# Patient Record
Sex: Female | Born: 1955 | Race: White | Hispanic: No | Smoking: Never smoker
Health system: Western US, Academic
[De-identification: ages and names within clinical notes are randomized; demographics above are authoritative.]

## PROBLEM LIST (undated history)

## (undated) DIAGNOSIS — F329 Major depressive disorder, single episode, unspecified: Secondary | ICD-10-CM

## (undated) DIAGNOSIS — C499 Malignant neoplasm of connective and soft tissue, unspecified: Secondary | ICD-10-CM

## (undated) DIAGNOSIS — D219 Benign neoplasm of connective and other soft tissue, unspecified: Secondary | ICD-10-CM

## (undated) DIAGNOSIS — F32A Depression, unspecified: Secondary | ICD-10-CM

## (undated) DIAGNOSIS — F419 Anxiety disorder, unspecified: Principal | ICD-10-CM

## (undated) HISTORY — DX: Anxiety disorder, unspecified: F41.9

## (undated) HISTORY — DX: Depression, unspecified: F32.A

## (undated) HISTORY — DX: Benign neoplasm of connective and other soft tissue, unspecified: D21.9

## (undated) HISTORY — DX: Major depressive disorder, single episode, unspecified: F32.9

## (undated) HISTORY — DX: Malignant neoplasm of connective and soft tissue, unspecified: C49.9

---

## 1991-05-07 HISTORY — PX: ARTHROPLASTY, KNEE, TOTAL: SHX000058

## 1991-10-22 ENCOUNTER — Other Ambulatory Visit: Payer: Self-pay

## 2006-05-06 HISTORY — PX: HYSTERECTOMY, RADICAL: SHX000829

## 2010-05-06 HISTORY — PX: COLONOSCOPY: GILAB00002

## 2016-04-05 ENCOUNTER — Other Ambulatory Visit (HOSPITAL_BASED_OUTPATIENT_CLINIC_OR_DEPARTMENT_OTHER): Payer: Self-pay | Admitting: Internal Medicine

## 2016-04-05 MED ORDER — CITALOPRAM 40 MG TABLET
40.0000 mg | ORAL_TABLET | Freq: Every day | ORAL | 1 refills | Status: DC
Start: 2016-04-05 — End: 2016-06-03

## 2016-04-05 NOTE — Telephone Encounter (Signed)
sent 

## 2016-04-08 ENCOUNTER — Encounter (HOSPITAL_BASED_OUTPATIENT_CLINIC_OR_DEPARTMENT_OTHER): Admitting: Internal Medicine

## 2016-04-26 ENCOUNTER — Encounter (HOSPITAL_BASED_OUTPATIENT_CLINIC_OR_DEPARTMENT_OTHER): Payer: Self-pay | Admitting: Internal Medicine

## 2016-04-26 ENCOUNTER — Ambulatory Visit (HOSPITAL_BASED_OUTPATIENT_CLINIC_OR_DEPARTMENT_OTHER): Admitting: Internal Medicine

## 2016-04-26 VITALS — BP 130/70 | HR 75 | Temp 97.0°F | Resp 16 | Ht 68.0 in | Wt 189.0 lb

## 2016-04-26 DIAGNOSIS — Z0000 Encounter for general adult medical examination without abnormal findings: Secondary | ICD-10-CM

## 2016-04-26 DIAGNOSIS — Z1231 Encounter for screening mammogram for malignant neoplasm of breast: Secondary | ICD-10-CM

## 2016-04-26 DIAGNOSIS — Z1239 Encounter for other screening for malignant neoplasm of breast: Secondary | ICD-10-CM

## 2016-04-26 DIAGNOSIS — F32A Depression, unspecified: Secondary | ICD-10-CM | POA: Insufficient documentation

## 2016-04-26 DIAGNOSIS — F419 Anxiety disorder, unspecified: Principal | ICD-10-CM

## 2016-04-26 DIAGNOSIS — E782 Mixed hyperlipidemia: Secondary | ICD-10-CM

## 2016-04-26 DIAGNOSIS — F418 Other specified anxiety disorders: Secondary | ICD-10-CM

## 2016-04-26 DIAGNOSIS — F329 Major depressive disorder, single episode, unspecified: Principal | ICD-10-CM

## 2016-04-26 DIAGNOSIS — E559 Vitamin D deficiency, unspecified: Secondary | ICD-10-CM
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
## 2016-04-26 NOTE — Progress Notes (Deleted)
HPI  Present to the clinic for BP follow up ,med refill and mammogram .  HTN -she brought her BP log and BP runs 140s and 80-90 . She is taking BP as prescribed  . Not eating healthy . She is doing brisk walking every day . No chest pain ,sob,palpitation .  Colonoscopy -was done when she was 32 .Normal colonoscopy per patient .  Mammogram-she is due for mammogram .  PAP- Was doen few months back        Review of Systems   Constitutional: Negative.    HENT: Negative.    Respiratory: Negative.    Cardiovascular: Negative.    Musculoskeletal: Positive for back pain (Lower backpain radaite to right legs ).   Neurological: Negative.           Physical Exam   Constitutional: She is oriented to person, place, and time. She appears well-developed and well-nourished.   HENT:   Head: Normocephalic and atraumatic.   Cardiovascular: Normal rate and regular rhythm.    Pulmonary/Chest: Effort normal and breath sounds normal.   Neurological: She is alert and oriented to person, place, and time.   Skin: Skin is warm and dry.   Psychiatric: She has a normal mood and affect.         ***

## 2016-05-16 ENCOUNTER — Ambulatory Visit

## 2016-05-24 ENCOUNTER — Encounter (HOSPITAL_BASED_OUTPATIENT_CLINIC_OR_DEPARTMENT_OTHER): Payer: Self-pay | Admitting: Internal Medicine

## 2016-06-03 ENCOUNTER — Other Ambulatory Visit (HOSPITAL_BASED_OUTPATIENT_CLINIC_OR_DEPARTMENT_OTHER): Payer: Self-pay | Admitting: Internal Medicine

## 2016-06-03 NOTE — Telephone Encounter (Signed)
lov 12.22.17  No f/u

## 2016-06-11 ENCOUNTER — Ambulatory Visit

## 2016-06-11 ENCOUNTER — Ambulatory Visit
Admission: RE | Admit: 2016-06-11 | Discharge: 2016-06-16 | Disposition: A | Discharge status: Home / Self Care | Source: Ambulatory Visit | Attending: Internal Medicine | Admitting: Internal Medicine

## 2016-06-11 DIAGNOSIS — Z1231 Encounter for screening mammogram for malignant neoplasm of breast: Principal | ICD-10-CM

## 2016-06-11 DIAGNOSIS — E782 Mixed hyperlipidemia: Secondary | ICD-10-CM

## 2016-06-11 DIAGNOSIS — Z0000 Encounter for general adult medical examination without abnormal findings: Secondary | ICD-10-CM

## 2016-06-11 DIAGNOSIS — Z1239 Encounter for other screening for malignant neoplasm of breast: Secondary | ICD-10-CM

## 2016-06-11 DIAGNOSIS — E559 Vitamin D deficiency, unspecified: Secondary | ICD-10-CM

## 2016-06-11 LAB — COMPREHENSIVE METABOLIC PANEL
ALANINE TRANSFERASE (ALT): 24 U/L (ref 4–56)
ALB/GLOB RATIO_MMC: 1.3 (ref 1.0–1.6)
ALBUMIN: 4.1 g/dL (ref 3.2–4.7)
ALKALINE PHOSPHATASE (ALP): 66 U/L (ref 38–126)
ASPARTATE TRANSAMINASE (AST): 27 U/L (ref 9–44)
BILIRUBIN TOTAL: 0.6 mg/dL (ref 0.1–2.2)
BUN/CREATININE_MMC: 19.1 (ref 7.3–21.7)
CALCIUM: 9.9 mg/dL (ref 8.7–10.2)
CARBON DIOXIDE TOTAL: 29 mmol/L (ref 22–32)
CHLORIDE: 103 mmol/L (ref 99–109)
CREATININE BLOOD: 0.68 mg/dL (ref 0.50–1.30)
E-GFR, NON-AFRICAN AMERICAN: 88 mL/min/{1.73_m2}
E-GFR_MMC: 88 mL/min/{1.73_m2}
GLOBULIN BLOOD_MMC: 3.2 g/dL (ref 2.2–4.2)
Glucose: 95 mg/dL (ref 70–100)
POTASSIUM: 3.6 mmol/L (ref 3.5–5.2)
PROTEIN: 7.3 g/dL (ref 5.9–8.2)
Sodium: 139 mmol/L (ref 134–143)
UREA NITROGEN, BLOOD (BUN): 13 mg/dL (ref 6–21)

## 2016-06-11 LAB — CBC WITH DIFFERENTIAL
BASOPHILS % AUTO: 0.6 % (ref 0.0–1.0)
BASOPHILS ABS AUTO: 0 10*3/uL (ref 0.0–0.1)
EOSINOPHIL % AUTO: 2.8 % (ref 0.0–4.0)
EOSINOPHIL ABS AUTO: 0.2 10*3/uL (ref 0.0–0.2)
HEMATOCRIT: 41.3 % (ref 36.0–48.0)
HEMOGLOBIN: 14 g/dL (ref 12.0–16.0)
IMMATURE GRANULOCYTES % AUTO: 0 % (ref 0.00–0.50)
IMMATURE GRANULOCYTES ABS AUTO: 0 10*3/uL (ref 0.0–0.0)
LYMPHOCYTE ABS AUTO: 1.2 10*3/uL — AB (ref 1.3–2.9)
LYMPHOCYTES % AUTO: 22.2 % (ref 5.0–41.0)
MCH: 30.9 pg (ref 27.0–34.0)
MCHC G/DL: 33.9 g/dL (ref 33.0–37.0)
MCV: 91.2 fL (ref 82.0–97.0)
MONOCYTES % AUTO: 13.7 % — AB (ref 0.0–10.0)
MONOCYTES ABS AUTO: 0.7 10*3/uL (ref 0.3–0.8)
MPV: 9.4 fL (ref 9.4–12.4)
NEUTROPHIL ABS AUTO: 3.28 10*3/uL (ref 2.20–4.80)
NEUTROPHILS % AUTO: 60.7 % (ref 45.0–75.0)
NUCLEATED CELL COUNT: 0 10*3/uL (ref 0.0–0.1)
NUCLEATED RBC/100 WBC: 0 %{WBCs} (ref <=0.0)
PLATELET COUNT: 223 10*3/uL (ref 151–365)
RDW: 13.1 % (ref 11.5–14.5)
RED CELL COUNT: 4.53 10*6/uL (ref 3.80–5.10)
WHITE BLOOD CELL COUNT: 5.4 10*3/uL (ref 4.2–10.8)

## 2016-06-11 LAB — LIPID PANEL
CHOL HDL RATIO MMC: 5.2 mg/dL — AB (ref 2.0–5.0)
CHOLESTEROL: 279 mg/dL — AB (ref 112–200)
HDL CHOLESTEROL: 54 mg/dL (ref 40–?)
LDL CHOLESTEROL CALCULATION: 191 mg/dL — AB (ref <100)
NON-HDL CHOLESTEROL: 225 mg/dL — AB (ref <=150.0)
TRIGLYCERIDE: 168 mg/dL — AB (ref 30–150)

## 2016-06-11 LAB — VITAMIN D, 25 HYDROXY: VITAMIN D, 25 HYDROXY: 47 ng/mL (ref 30–80)

## 2016-06-17 ENCOUNTER — Encounter (HOSPITAL_BASED_OUTPATIENT_CLINIC_OR_DEPARTMENT_OTHER): Payer: Self-pay | Admitting: Internal Medicine

## 2016-06-17 ENCOUNTER — Ambulatory Visit (HOSPITAL_BASED_OUTPATIENT_CLINIC_OR_DEPARTMENT_OTHER): Admitting: Internal Medicine

## 2016-06-17 VITALS — BP 120/70 | HR 84 | Temp 96.7°F | Resp 16 | Wt 190.6 lb

## 2016-06-17 DIAGNOSIS — N952 Postmenopausal atrophic vaginitis: Secondary | ICD-10-CM | POA: Insufficient documentation

## 2016-06-17 DIAGNOSIS — F419 Anxiety disorder, unspecified: Secondary | ICD-10-CM

## 2016-06-17 DIAGNOSIS — F329 Major depressive disorder, single episode, unspecified: Secondary | ICD-10-CM

## 2016-06-17 DIAGNOSIS — E782 Mixed hyperlipidemia: Secondary | ICD-10-CM | POA: Insufficient documentation

## 2016-06-17 DIAGNOSIS — Z0000 Encounter for general adult medical examination without abnormal findings: Principal | ICD-10-CM

## 2016-06-17 DIAGNOSIS — F418 Other specified anxiety disorders: Secondary | ICD-10-CM

## 2016-06-17 MED ORDER — ATORVASTATIN 40 MG TABLET
40.0000 mg | ORAL_TABLET | Freq: Every day | ORAL | 1 refills | Status: DC
Start: 2016-06-17 — End: 2016-09-17

## 2016-06-17 MED ORDER — CONJUGATED ESTROGENS 0.625 MG/GRAM VAGINAL CREAM
0.5000 g | TOPICAL_CREAM | Freq: Every day | VAGINAL | 2 refills | Status: DC
Start: 2016-06-17 — End: 2016-09-17

## 2016-06-17 NOTE — Progress Notes (Signed)
Lauren Macdonald is a 61 year old woman with a PMH of anxiety and depression who came to the clinic today for an annual physical exam and follow up of labs.  She had blood work done and a mammogram done before this visit, which I reviewed in detail with her.      On review of her labs, her chem panel and CBC were normal, but her lipid panel showed an elevated LDL of 191. On further questioning, she states that she always had a history of high cholesterol in the past and even her mother suffers with the same problem of high cholesterol.  She was never on any medications in the past and always tried to control with diet and exercise.  She in fact mentioned that at one point her LDL was around 205.  She denies any history of coronary artery disease or stroke in the past.  She follows a low-cholesterol diet and exercises regularly.  I reviewed all the labs and mammogram in detail with her.     She walks for 1-2 miles every day and eats a healthy balanced diet.  last colonoscopy 2012, no polyps per pt  Last mammogram: 06/11/2016, reviewed results  Last PAP: s/p hysterectomy for uterine fibroids in 2008    Anxiety and depression: has been taking Citalopram 40 mg daily for >26yrs. Denied any active symptoms currently       Review of Systems   Constitutional: Negative for fatigue, fever and unexpected weight change.   HENT: Negative.    Eyes: Negative for visual disturbance.   Respiratory: Negative for cough and shortness of breath.    Cardiovascular: Negative for chest pain, palpitations and leg swelling.   Gastrointestinal: Negative for abdominal pain, blood in stool and constipation.   Endocrine: Negative.    Genitourinary: Positive for dyspareunia. Negative for dysuria, pelvic pain, vaginal bleeding and vaginal discharge.   Musculoskeletal: Positive for back pain. Negative for arthralgias.   Neurological: Negative for dizziness and headaches.   Psychiatric/Behavioral: Negative.           Physical Exam   Constitutional: She  is oriented to person, place, and time. She appears well-developed and well-nourished. No distress.   HENT:   Head: Normocephalic.   Right Ear: External ear normal.   Left Ear: External ear normal.   Nose: Nose normal.   Mouth/Throat: Oropharynx is clear and moist. No oropharyngeal exudate.   Eyes: Conjunctivae and EOM are normal. Pupils are equal, round, and reactive to light. No scleral icterus.   Neck: Normal range of motion. Neck supple. No thyromegaly present.   Cardiovascular: Normal rate, regular rhythm, normal heart sounds and intact distal pulses.    No murmur heard.  Pulmonary/Chest: Effort normal and breath sounds normal. No respiratory distress. She has no wheezes. She has no rales. She exhibits no tenderness.   Breast exam normal. No axillary adenopathy   Abdominal: Soft. Bowel sounds are normal. She exhibits no distension and no mass. There is no tenderness.   Genitourinary: No vaginal discharge found.   Genitourinary Comments: Dry and atrophic vaginal walls   Musculoskeletal: Normal range of motion. She exhibits no edema, tenderness or deformity.   Lymphadenopathy:     She has no cervical adenopathy.   Neurological: She is alert and oriented to person, place, and time. She has normal reflexes. No cranial nerve deficit.   Skin: Skin is warm. No rash noted.   Psychiatric: She has a normal mood and affect.   Vitals reviewed.  Marsi was seen today for results and physical.    Diagnoses and all orders for this visit:    Annual physical exam  - exam normal, labs reviewed in detail.  - continue to follow a healthy balanced diet and exercise regularly    Mixed hyperlipidemia  -     LIPID PANEL; Future  - lipid panel reviewed and LDL >190, which is in an indication for high intensity statin therapy. Will start Atorvastatin 40 mg and recheck lipid panel in 3 months.   - follow low cholesterol diet    Anxiety and depression  - continue celexa      Vaginal atrophy  - symp and exam consistent with pots  menopausal vaginal atrophy. Will start topical HRT.  - side effects discussed    Other orders  -     Atorvastatin (LIPITOR) 40 mg tablet; Take 1 tablet by mouth every day.  -     Conjugated Estrogens (PREMARIN) 0.625 mg/gram Vaginal Cream; Insert 0.5 g into the vagina every day. Daily for 1 week and then 1 gm twice weekly there after

## 2016-06-19 NOTE — Progress Notes (Deleted)
Lauren Macdonald is a 61 year old woman with a PMH of anxiety and depression who came to the clinic today for an annual physical exam and follow up of labs.  She had blood work done and a mammogram done before this visit, which I reviewed in detail with her.      On review of her labs, her chem panel and CBC were normal, but her lipid panel showed an elevated LDL of 191. On further questioning, she states that she always had a history of high cholesterol in the past and even her mother suffers with the same problem of high cholesterol.  She was never on any medications in the past and always tried to control with diet and exercise.  She in fact mentioned that at one point her LDL was around 205.  She denies any history of coronary artery disease or stroke in the past.  She follows a low-cholesterol diet and exercises regularly.  I reviewed all the labs and mammogram in detail with her.     She walks for 1-2 miles every day and eats a healthy balanced diet.  last colonoscopy 2012, no polyps per pt  Last mammogram: 06/11/2016, reviewed results  Last PAP: s/p hysterectomy for uterine fibroids in 2008    Anxiety and depression: has been taking Citalopram 40 mg daily for >55yrs. Denied any active symptoms currently     Review of Systems   Constitutional: Negative for fatigue, fever and unexpected weight change.   HENT: Negative.    Eyes: Negative for visual disturbance.   Respiratory: Negative for cough and shortness of breath.    Cardiovascular: Negative for chest pain, palpitations and leg swelling.   Gastrointestinal: Negative for abdominal pain, blood in stool and constipation.   Endocrine: Negative.    Genitourinary: Positive for dyspareunia. Negative for dysuria, pelvic pain, vaginal bleeding and vaginal discharge.   Musculoskeletal: Positive for back pain. Negative for arthralgias.   Neurological: Negative for dizziness and headaches.   Psychiatric/Behavioral: Negative.           Physical Exam   Constitutional: She is  oriented to person, place, and time. She appears well-developed and well-nourished. No distress.   HENT:   Head: Normocephalic.   Right Ear: External ear normal.   Left Ear: External ear normal.   Nose: Nose normal.   Mouth/Throat: Oropharynx is clear and moist. No oropharyngeal exudate.   Eyes: Conjunctivae and EOM are normal. Pupils are equal, round, and reactive to light. No scleral icterus.   Neck: Normal range of motion. Neck supple. No thyromegaly present.   Cardiovascular: Normal rate, regular rhythm, normal heart sounds and intact distal pulses.    No murmur heard.  Pulmonary/Chest: Effort normal and breath sounds normal. No respiratory distress. She has no wheezes. She has no rales. She exhibits no tenderness.   Breast exam normal. No axillary adenopathy   Abdominal: Soft. Bowel sounds are normal. She exhibits no distension and no mass. There is no tenderness.   Genitourinary: No vaginal discharge found.   Genitourinary Comments: Dry and atrophic vaginal walls   Musculoskeletal: Normal range of motion. She exhibits no edema, tenderness or deformity.   Lymphadenopathy:     She has no cervical adenopathy.   Neurological: She is alert and oriented to person, place, and time. She has normal reflexes. No cranial nerve deficit.   Skin: Skin is warm. No rash noted.   Psychiatric: She has a normal mood and affect.   Vitals reviewed.  Lauren Macdonald was seen today for results and physical.    Diagnoses and all orders for this visit:    Annual physical exam  - exam normal, labs reviewed in detail.  - continue to follow a healthy balanced diet and exercise regularly    Mixed hyperlipidemia  -     LIPID PANEL; Future  - lipid panel reviewed and LDL >190, which is in an indication for high intensity statin therapy. Will start Atorvastatin 40 mg and recheck lipid panel in 3 months.   - follow low cholesterol diet    Anxiety and depression  - continue celexa      Vaginal atrophy  - symp and exam consistent with pots  menopausal vaginal atrophy. Will start topical HRT.  - side effects discussed    Other orders  -     Atorvastatin (LIPITOR) 40 mg tablet; Take 1 tablet by mouth every day.  -     Conjugated Estrogens (PREMARIN) 0.625 mg/gram Vaginal Cream; Insert 0.5 g into the vagina every day. Daily for 1 week and then 1 gm twice weekly there after

## 2016-07-10 ENCOUNTER — Telehealth (HOSPITAL_BASED_OUTPATIENT_CLINIC_OR_DEPARTMENT_OTHER): Payer: Self-pay | Admitting: Internal Medicine

## 2016-07-10 NOTE — Telephone Encounter (Signed)
Pt states she has had Muscle pain, and stomach pain. Waking pt through out the night thinks it may be due to the new statin medication she is taking x1 month.   Please advise

## 2016-07-11 NOTE — Telephone Encounter (Signed)
Patient notified

## 2016-07-11 NOTE — Telephone Encounter (Signed)
Please tell her to stop the statin medication and see if the pain improves. She needs to call us back in a week for follow up

## 2016-09-17 ENCOUNTER — Encounter (HOSPITAL_BASED_OUTPATIENT_CLINIC_OR_DEPARTMENT_OTHER): Payer: Self-pay | Admitting: Internal Medicine

## 2016-09-17 ENCOUNTER — Ambulatory Visit (HOSPITAL_BASED_OUTPATIENT_CLINIC_OR_DEPARTMENT_OTHER): Admitting: Internal Medicine

## 2016-09-17 VITALS — BP 112/74 | HR 80 | Temp 97.5°F | Resp 16 | Wt 193.6 lb

## 2016-09-17 DIAGNOSIS — M5136 Other intervertebral disc degeneration, lumbar region: Secondary | ICD-10-CM

## 2016-09-17 DIAGNOSIS — M545 Low back pain: Secondary | ICD-10-CM

## 2016-09-17 DIAGNOSIS — E782 Mixed hyperlipidemia: Principal | ICD-10-CM

## 2016-09-17 DIAGNOSIS — N952 Postmenopausal atrophic vaginitis: Secondary | ICD-10-CM

## 2016-09-17 DIAGNOSIS — G8929 Other chronic pain: Secondary | ICD-10-CM

## 2016-09-17 MED ORDER — ATORVASTATIN 20 MG TABLET
20.0000 mg | ORAL_TABLET | Freq: Every day | ORAL | 1 refills | Status: AC
Start: 2016-09-17 — End: 2017-09-12

## 2016-09-17 MED ORDER — CONJUGATED ESTROGENS 0.625 MG/GRAM VAGINAL CREAM
1.0000 g | TOPICAL_CREAM | Freq: Every day | VAGINAL | 2 refills | Status: AC
Start: 2016-09-17 — End: 2017-09-12

## 2016-09-17 NOTE — Patient Instructions (Signed)
Back Stretches: Exercises  Your Care Instructions  Here are some examples of exercises for stretching your back. Start each exercise slowly. Ease off the exercise if you start to have pain.  Your doctor or physical therapist will tell you when you can start these exercises and which ones will work best for you.  How to do the exercises  Overhead stretch    1. Stand comfortably with your feet shoulder-width apart.  2. Looking straight ahead, raise both arms over your head and reach toward the ceiling. Do not allow your head to tilt back.  3. Hold for 15 to 30 seconds, then lower your arms to your sides.  4. Repeat 2 to 4 times.  Side stretch    1. Stand comfortably with your feet shoulder-width apart.  2. Raise one arm over your head, and then lean to the other side.  3. Slide your hand down your leg as you let the weight of your arm gently stretch your side muscles. Hold for 15 to 30 seconds.  4. Repeat 2 to 4 times on each side.  Press-up    1. Lie on your stomach, supporting your body with your forearms.  2. Press your elbows down into the floor to raise your upper back. As you do this, relax your stomach muscles and allow your back to arch without using your back muscles. As your press up, do not let your hips or pelvis come off the floor.  3. Hold for 15 to 30 seconds, then relax.  4. Repeat 2 to 4 times.  Relax and rest    1. Lie on your back with a rolled towel under your neck and a pillow under your knees. Extend your arms comfortably to your sides.  2. Relax and breathe normally.  3. Remain in this position for about 10 minutes.  4. If you can, do this 2 or 3 times each day.  Follow-up care is a key part of your treatment and safety. Be sure to make and go to all appointments, and call your doctor if you are having problems. It's also a good idea to know your test results and keep a list of the medicines you take.   Where can you learn more?   Go to https://www.healthwise.net/patiented  Enter Y090 in the  search box to learn more about "Back Stretches: Exercises."    2006-2015 [employer]. Care instructions adapted under license by [facility]. This care instruction is for use with your licensed healthcare professional. If you have questions about a medical condition or this instruction, always ask your healthcare professional. [employer], Inc[employer] disclaims any warranty or liability for your use of this information.  Content Version: 10.6.465758; Current as of: Sep 24, 2013

## 2016-09-17 NOTE — Progress Notes (Signed)
Lauren Macdonald is a 61 year old woman with a PMH of anxiety and depression, hyperlipidemia, lumbar DDD, vaginal atrophy who came to the clinic today for f/u of hyperlipidemia and other medical problems.     Hyperlipidemia: her LDL was 191, so I started her on Atorvastatin 40 mg which she could not tolerate. She reported nausea, diarrhea and fatigue. No myalgias. So she quit taking it. She denies any history of coronary artery disease or stroke in the past. She follows a low-cholesterol diet and exercises regularly.    Vaginal atrophy: she is using premarin topical cream twice weekly and reports improvement of dryness. Denied vaginal bleeding    Low back pain: toady she states that she has known Lumbar DDD and lately has been experiencing stiffness and pain in the lower back when she gets up in the morning. After few minutes pain improves. Pain is again worse at end of day. Denied radiation of pain, tingling, numbness,weakness.          Review of Systems   Constitutional: Negative for fatigue, fever and unexpected weight change.   HENT: Negative.    Eyes: Negative for visual disturbance.   Respiratory: Negative for cough and shortness of breath.    Cardiovascular: Negative for chest pain, palpitations and leg swelling.   Gastrointestinal: Negative for abdominal pain, blood in stool and constipation.   Endocrine: Negative.    Genitourinary: Negative for dyspareunia, dysuria, vaginal bleeding and vaginal discharge.   Musculoskeletal: Positive for back pain. Negative for arthralgias and myalgias.   Neurological: Negative for dizziness and headaches.   Psychiatric/Behavioral: Negative.           Physical Exam   Constitutional: She is oriented to person, place, and time. She appears well-developed and well-nourished.   HENT:   Mouth/Throat: Oropharynx is clear and moist.   Cardiovascular: Normal rate, regular rhythm and normal heart sounds.    No murmur heard.  Pulmonary/Chest: Effort normal and breath sounds normal. No  respiratory distress. She has no wheezes.   Abdominal: Soft. Bowel sounds are normal.   Musculoskeletal: She exhibits no edema.   Neurological: She is alert and oriented to person, place, and time.   Skin: Skin is warm.   Psychiatric: She has a normal mood and affect.   Vitals reviewed.        Ledia was seen today for lipids.    Diagnoses and all orders for this visit:    Mixed hyperlipidemia  - pt was not able to tolerate high dose statin  - given her LDL >190, she should be on a statin. No h/o CAD, DM, CVA  - advised to start 10 mg of atorvastatin and go up to 20 if she can tolerate  - follow low cholesterol diet and exercise.     Vaginal atrophy  - continue topical estrogen twice weekly    DDD (degenerative disc disease), lumbar  - take NSAID'S as needed    Chronic midline low back pain without sciatica  - advised stretching exercises    Other orders  -     Conjugated Estrogens (PREMARIN) 0.625 mg/gram Vaginal Cream; Insert 1 g into the vagina every day. 1 gm twice weekly  -     Atorvastatin (LIPITOR) 20 mg Tablet; Take 1 tablet by mouth every day.

## 2016-10-04 ENCOUNTER — Other Ambulatory Visit (HOSPITAL_BASED_OUTPATIENT_CLINIC_OR_DEPARTMENT_OTHER): Payer: Self-pay | Admitting: Internal Medicine

## 2016-10-04 NOTE — Telephone Encounter (Signed)
LOV 5.15.18  NOV 8.16.18

## 2016-12-19 ENCOUNTER — Ambulatory Visit (HOSPITAL_BASED_OUTPATIENT_CLINIC_OR_DEPARTMENT_OTHER): Admitting: Internal Medicine

## 2019-06-29 ENCOUNTER — Other Ambulatory Visit (HOSPITAL_BASED_OUTPATIENT_CLINIC_OR_DEPARTMENT_OTHER): Payer: Self-pay | Admitting: Internal Medicine

## 2019-06-29 DIAGNOSIS — Z1231 Encounter for screening mammogram for malignant neoplasm of breast: Secondary | ICD-10-CM

## 2020-02-18 IMAGING — CT CT ABD/PEL W CONT
2 of 4 series · 11 of 46 positions shown, 12 images · non-contrast
Comparison: None.

HISTORY: Lower abdominal pain.
TECHNIQUE: Axial images of the abdomen and pelvis are obtained from the hemidiaphragms to the pubic symphysis following 100 mL Nsovue-533 intravenous contrast.  Serum creatinine 0.8 mg/dL. 900 mL Gastrografin/water oral contrast given. Coronal and sagittal reformation images are obtained.

[Series 6: soft tissue · axial · 0.70mm/px · z∈[-1647,-1288]mm · 8 of 92 slices shown, 9 images]
[im 10/92  soft-tissue]
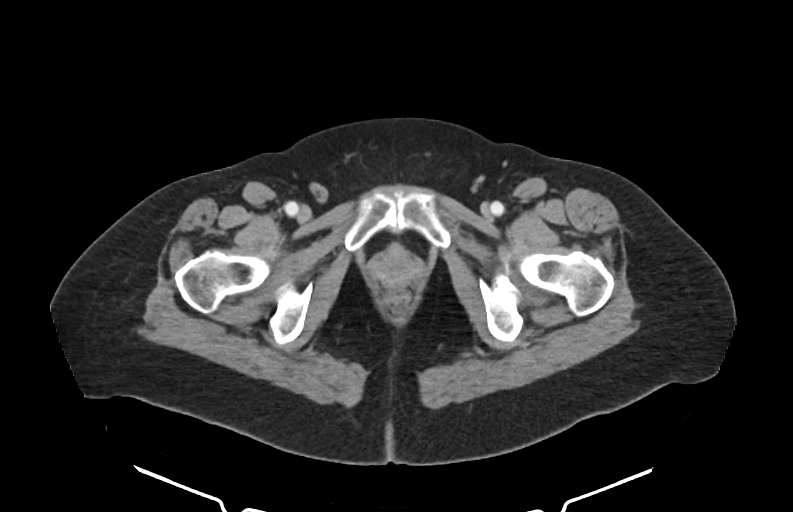
[im 10/92  bone]
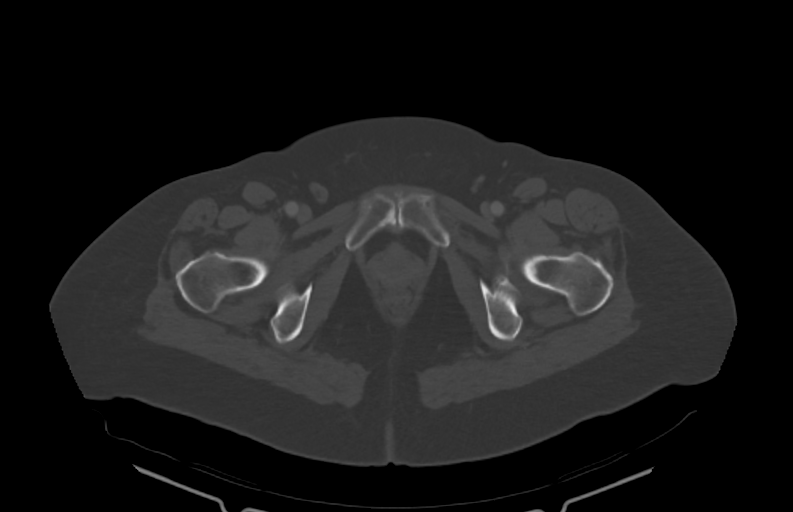
[im 19/92  soft-tissue]
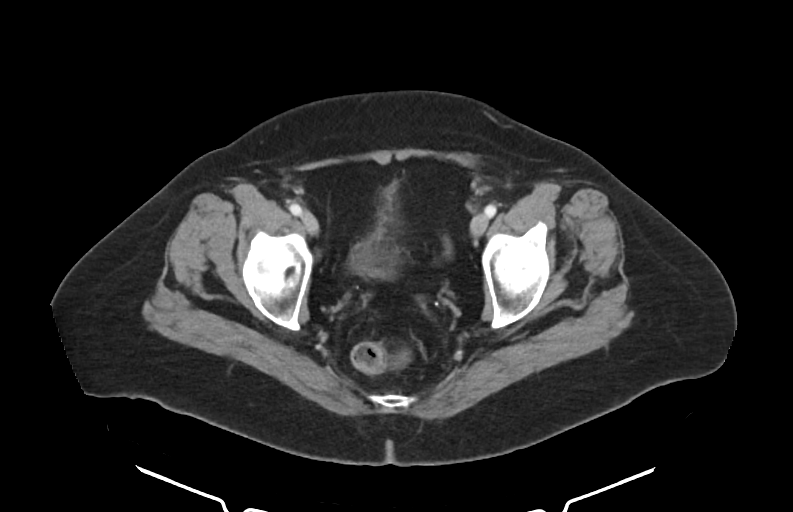
[im 28/92  soft-tissue]
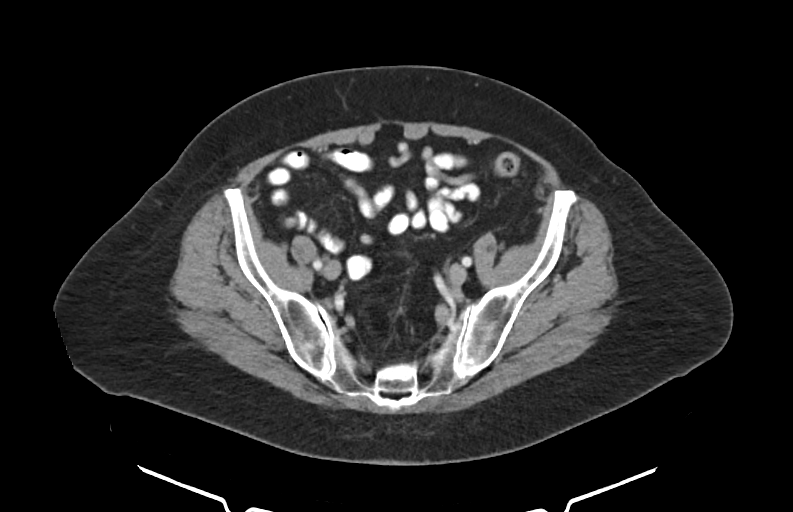
[im 41/92  soft-tissue]
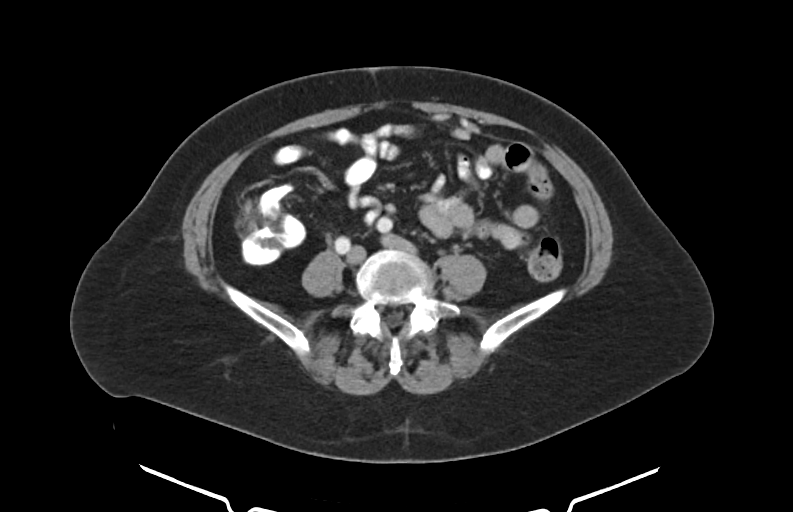
[im 51/92  soft-tissue]
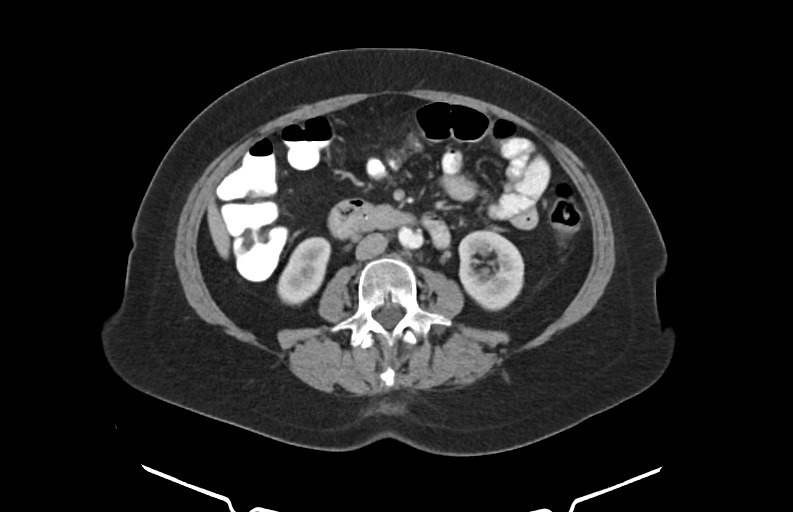
[im 64/92  soft-tissue]
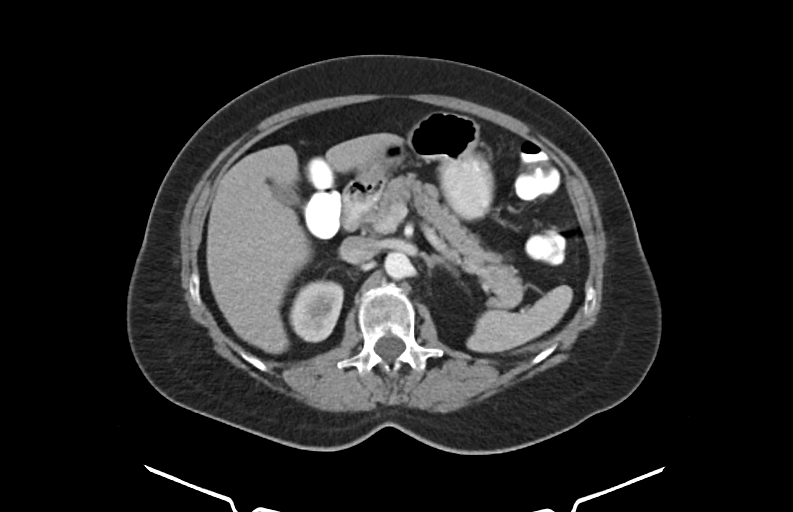
[im 73/92  soft-tissue]
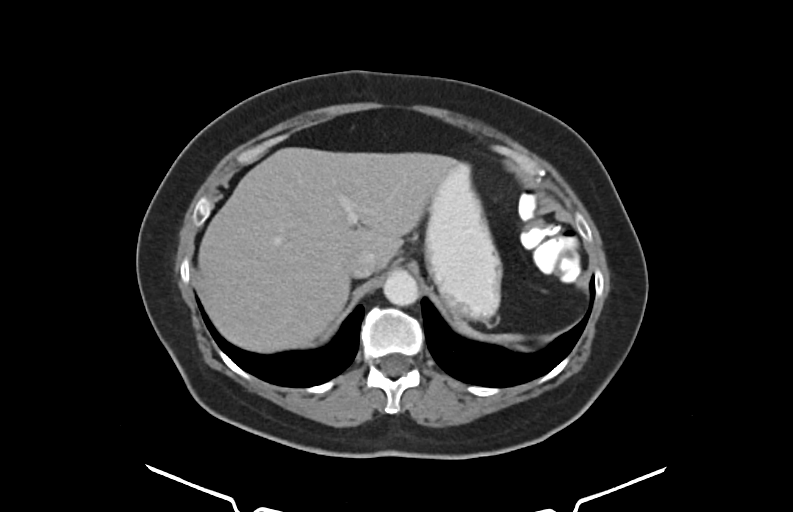
[im 82/92  soft-tissue]
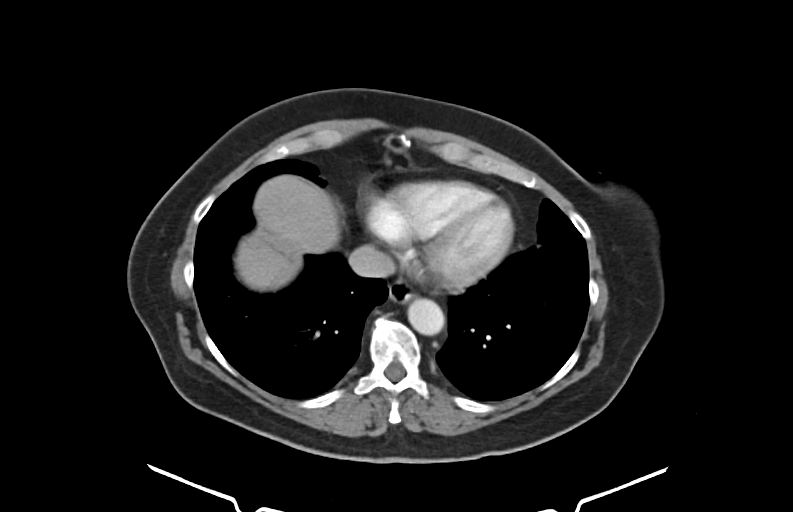

[Series 8: coronal · coronal · 0.90mm/px · 3 of 71 slices shown]
[im 24/71  soft-tissue]
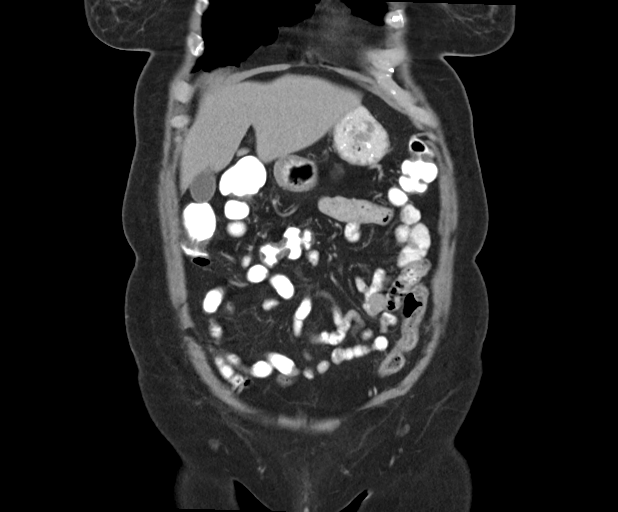
[im 32/71  soft-tissue]
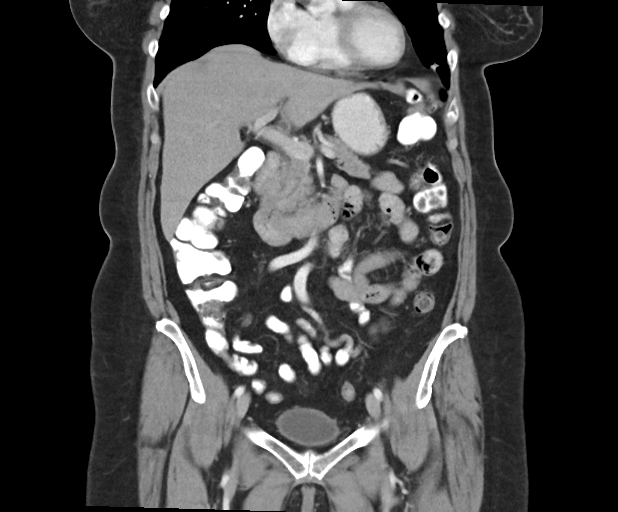
[im 39/71  soft-tissue]
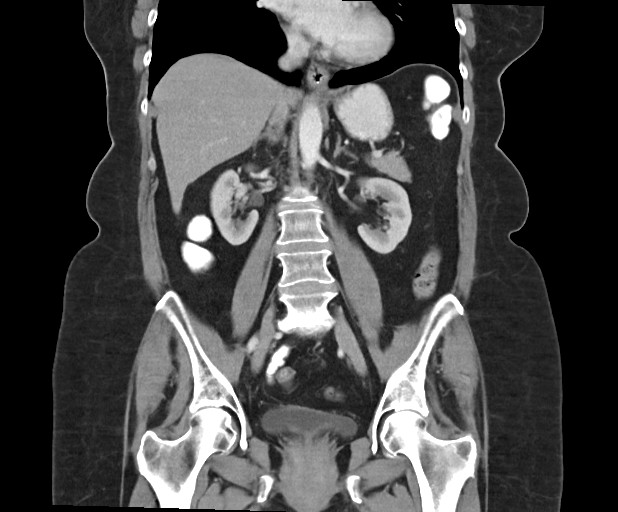

[11 of 46 positions shown; findings below may reference images not displayed]

FINDINGS: CT ABDOMEN: Small amount of subsegmental atelectasis involving middle lobe and lingular division of left upper lobe is seen.

Heart size is normal. There is no pericardial effusion. Aorta and inferior vena cava are normal.

Subtle nodular contour of liver identified. No liver mass lesion is seen. Questionable small cyst involving segment VI of liver seen on image 3-37.

Gallbladder, pancreas, spleen, adrenal glands and kidneys are normal.

Distal esophagus, stomach and bowel loops are normal. Appendix is normal.

No enlarged retroperitoneal lymph node is seen. Umbilical fatty hernia, 11 x 10 mm, is seen on image 6-49. Increased subcutaneous adipose tissue is seen. Breast tissue is normal. Curvature at thoracolumbar junction region to the right is seen.

CT PELVIS: Vaginal cuff is normal. Right ovary is likely normal. Likely 1.6 cm paraovarian cyst of left ovary is seen.

Urinary bladder is normal.

Distal bowel loops are normal.

Iliac arteries are within normal limits.

No enlarged pelvic wall or inguinal lymph nodes seen.

Bilateral inguinal indirect fatty hernias identified up to 2.9 cm in diameter on the left and 1.4 cm in diameter on the right.

Degenerative changes of SI joints, hip joints and pubic symphysis identified. Increased subcutaneous adipose tissue is seen.

Coronal and sagittal reformation images confirm above findings.
IMPRESSION: Umbilical fatty hernia and bilateral inguinal indirect fatty hernias are seen.

Likely small left-sided paraovarian cyst is seen.

Subtle nodular contour of liver, possibly due to early or mild liver cirrhosis is seen. Liver ultrasound elastography study may provide more information.

Additional chronic findings as noted.

Total radiation dose to patient is CTDIvol 24.06 mGy and DLP 630.66 mGy-cm.

## 2020-10-11 IMAGING — MR MRI BRAIN W/WO CONTRAST
13 series · 48 of 48 positions shown · IV contrast (20cc ProHance)
Comparison: None.

HISTORY: 65-years-old female with tension headache, essential tremor.
TECHNIQUE: Multiplanar, multisequence MRI images of the brain are obtained prior to and following intravenous contrast.

CONTRAST: 20 mL of ProHance

[Series 1: bSSFP · axial · 8.0mm · 1.17mm/px · 1 of 19 slices shown]
[im 1/19]
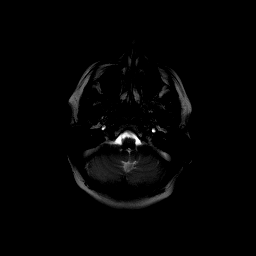

[Series 2: t1_mprage_axial · axial · 1.0mm · 1.00mm/px · z∈[-78,+81]mm · 5 of 160 slices shown]
[im 1/160]
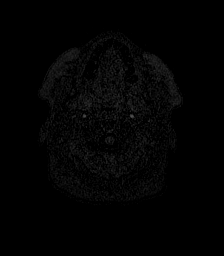
[im 40/160]
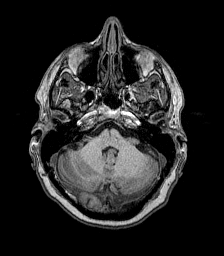
[im 80/160]
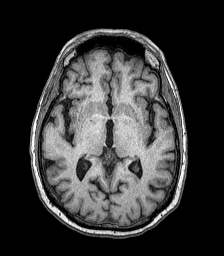
[im 120/160]
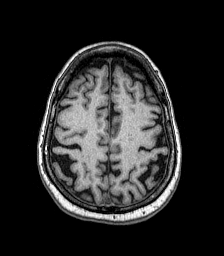
[im 160/160]
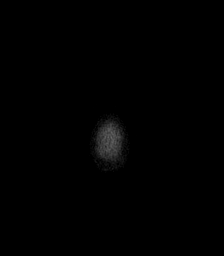

[Series 3: flair_axial_fs · axial · 5.0mm · 0.45mm/px · 1 of 24 slices shown]
[im 1/24]
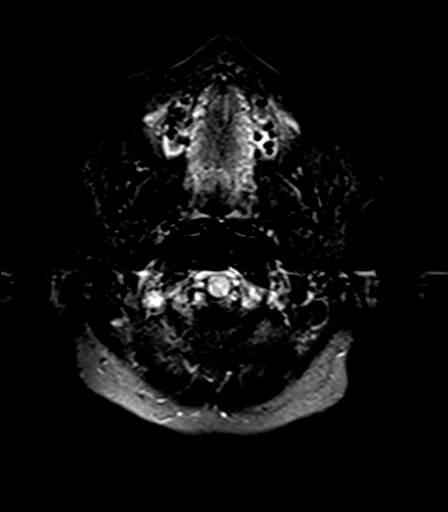

[Series 4: t1_mprage_cor_reformat · coronal · 1.5mm · 1.00mm/px · 6 of 180 slices shown]
[im 1/180]
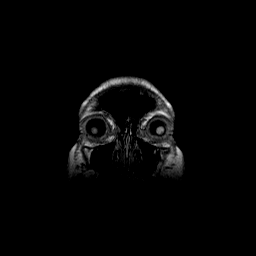
[im 36/180]
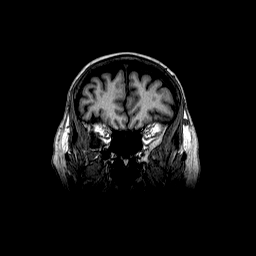
[im 72/180]
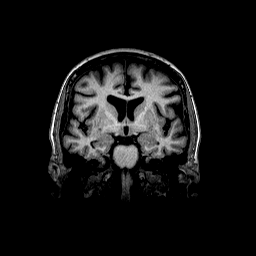
[im 108/180]
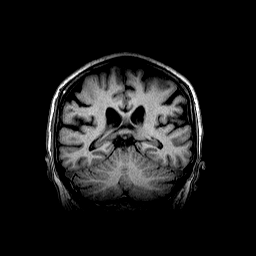
[im 144/180]
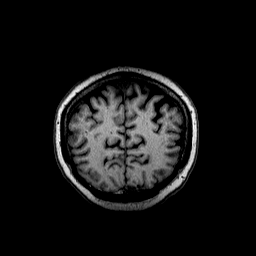
[im 180/180]
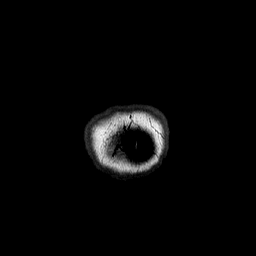

[Series 5: t1_mprage_sag_reformat · sagittal · 1.5mm · 1.00mm/px · 6 of 157 slices shown]
[im 1/157]
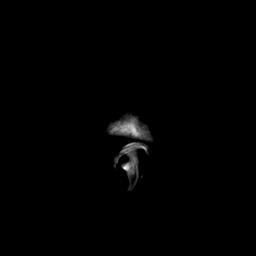
[im 32/157]
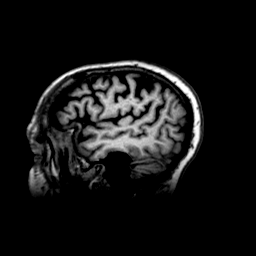
[im 63/157]
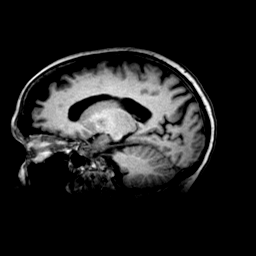
[im 94/157]
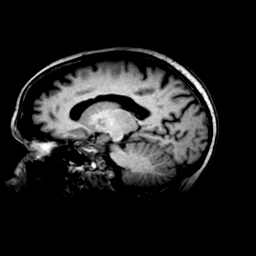
[im 125/157]
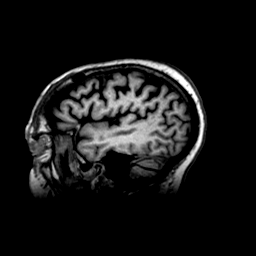
[im 157/157]
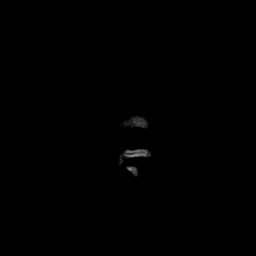

[Series 6: t2_axial · axial · 5.0mm · 0.72mm/px · 1 of 24 slices shown]
[im 1/24]
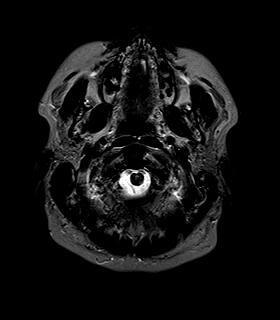

[Series 7: DWI · axial · 5.0mm · 1.26mm/px · 1 of 24 slices shown (1 of 2)]
[im 1/24]
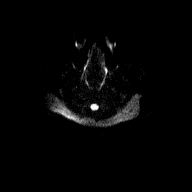

[Series 8: DWI · axial · 5.0mm · 1.26mm/px · 1 of 24 slices shown (2 of 2)]
[im 1/24]
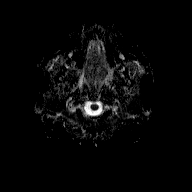

[Series 9: flash_axial · axial · 5.0mm · 0.45mm/px · 1 of 24 slices shown]
[im 1/24]
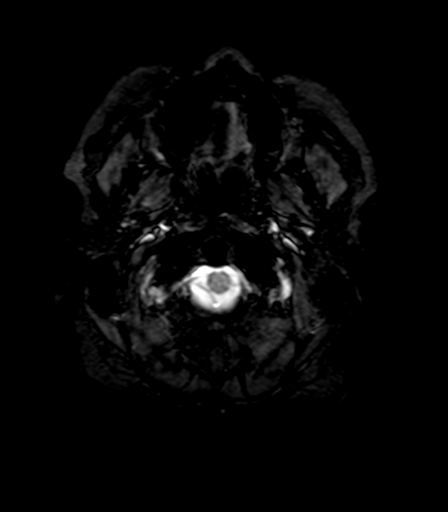

[Series 10: t1_mprage_axial_+c · axial · 1.0mm · 1.00mm/px · z∈[-78,+81]mm · 6 of 160 slices shown]
[im 1/160]
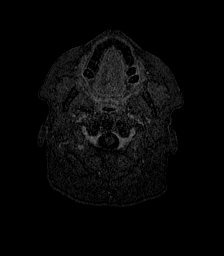
[im 32/160]
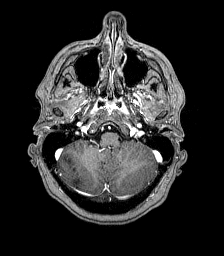
[im 64/160]
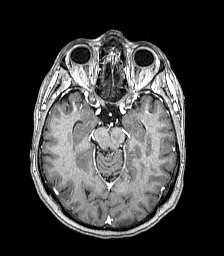
[im 96/160]
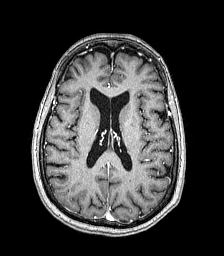
[im 128/160]
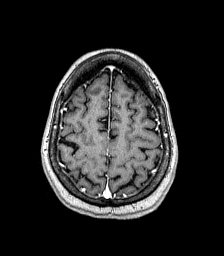
[im 160/160]
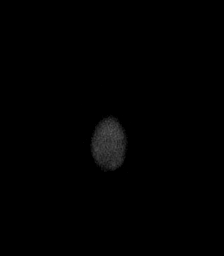

[Series 11: axial_sub · axial · 1.0mm · 1.00mm/px · z∈[-78,+81]mm · 6 of 160 slices shown]
[im 1/160]
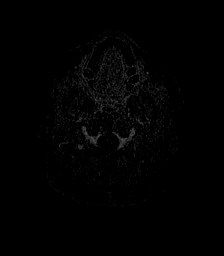
[im 32/160]
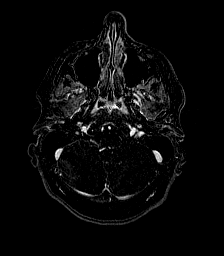
[im 64/160]
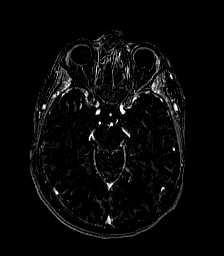
[im 96/160]
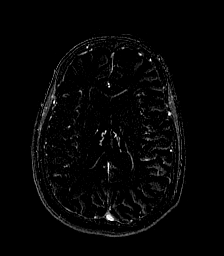
[im 128/160]
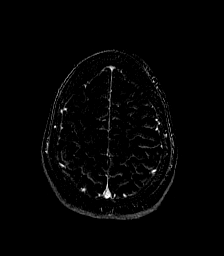
[im 160/160]
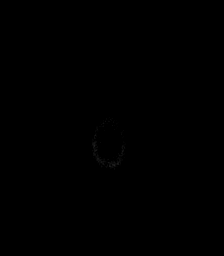

[Series 12: t1_mprage_sag_reformat_+c · sagittal · 1.5mm · 1.00mm/px · 6 of 163 slices shown]
[im 1/163]
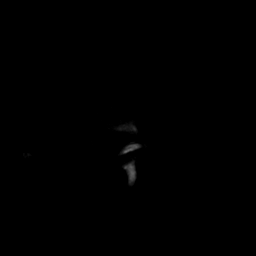
[im 33/163]
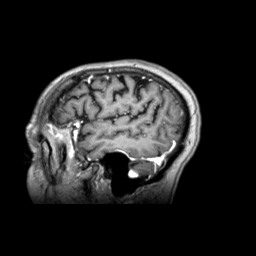
[im 65/163]
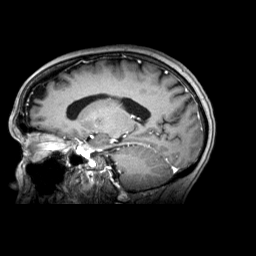
[im 98/163]
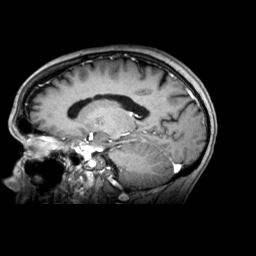
[im 130/163]
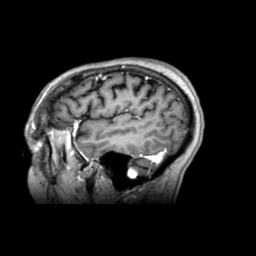
[im 163/163]
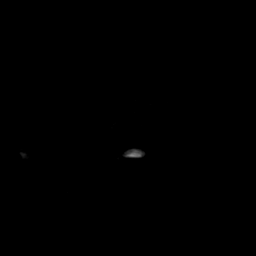

[Series 14: t1_mprage_cor_reformat_+c · coronal · 1.5mm · 1.00mm/px · 7 of 186 slices shown]
[im 1/186]
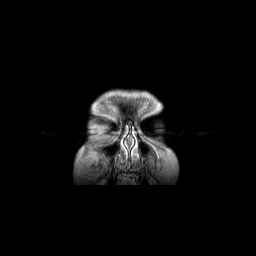
[im 31/186]
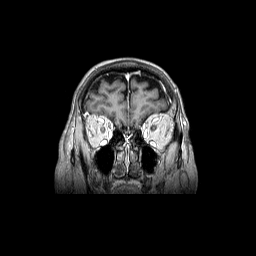
[im 62/186]
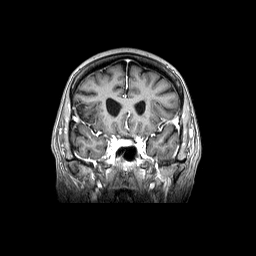
[im 93/186]
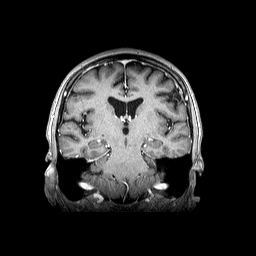
[im 124/186]
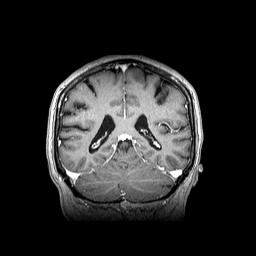
[im 155/186]
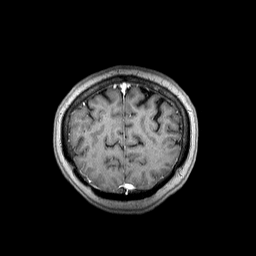
[im 186/186]
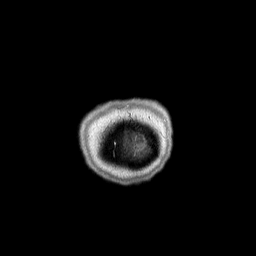

[48 of 48 positions shown; findings below may reference images not displayed]

FINDINGS: BRAIN PARENCHYMA: No acute infarct. Minimal supratentorial white matter T2/FLAIR hyperintensities are nonspecific but can be seen with migraines and/or chronic microangiopathic changes. Mild global brain atrophy. No abnormal intracranial enhancement.

PITUITARY: Unremarkable.

VASCULATURE: Expected major intracranial flow voids are present.

VENTRICULAR SYSTEM: No hydrocephalus or midline shift.

EXTRA-AXIAL SPACES: No intra- or extra-axial fluid collections.

ORBITS: Unremarkable.

PARANASAL SINUSES AND MASTOID AIR CELLS: Predominantly clear.

BONES: Unremarkable.
IMPRESSION: No intracranial abnormality to explain patient's symptoms.

## 2021-10-11 IMAGING — CR T-SPINE 3 VWS
3 series · 3 of 3 positions shown · non-contrast
Comparison: None

Images Obtained from [facility]
T-spine 3 views
INDICATIONS:  Pain in thoracic spine

[t thoracic spine ap]
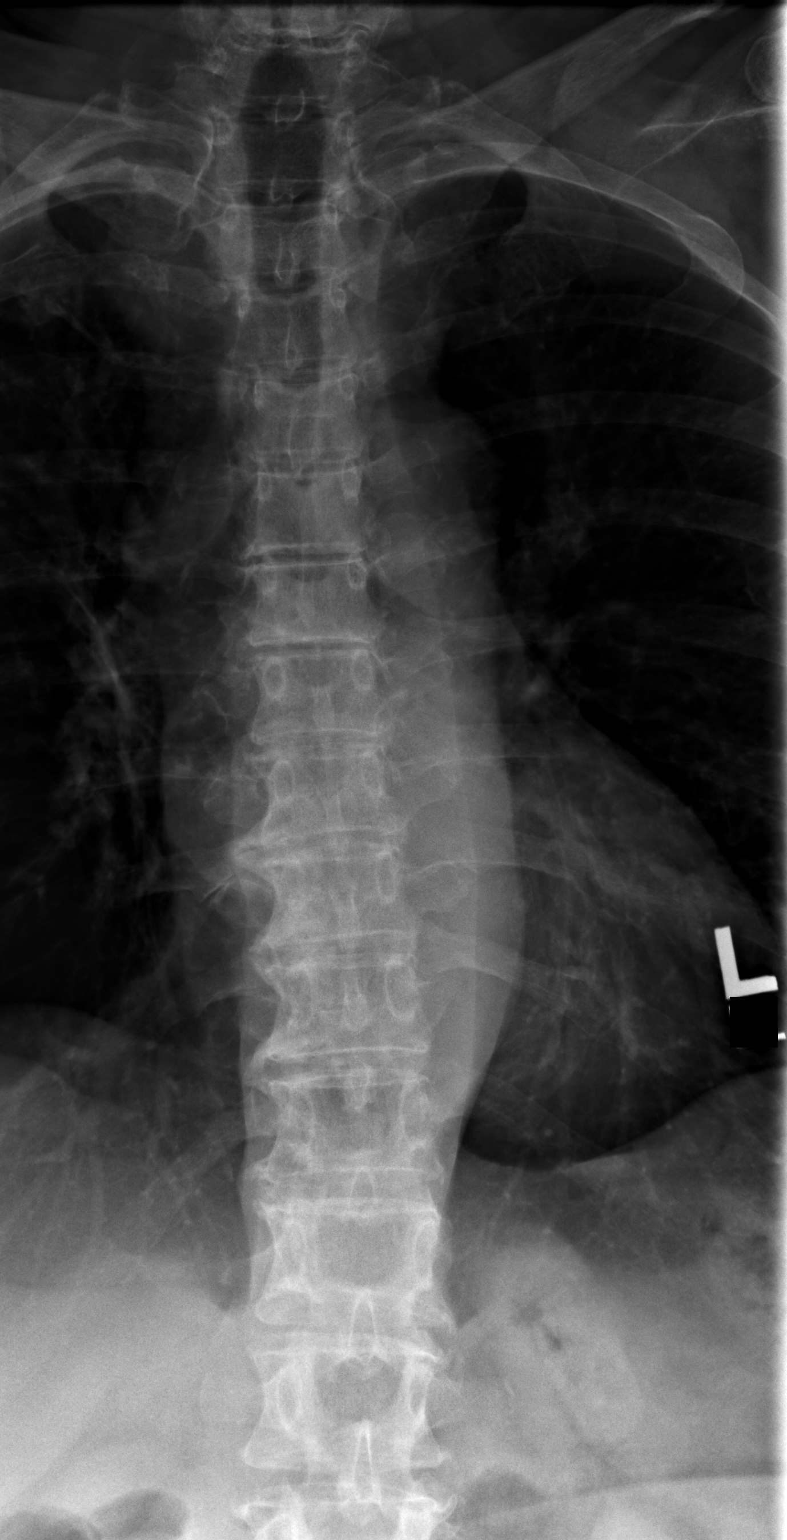

[t thoracic breathing lat]
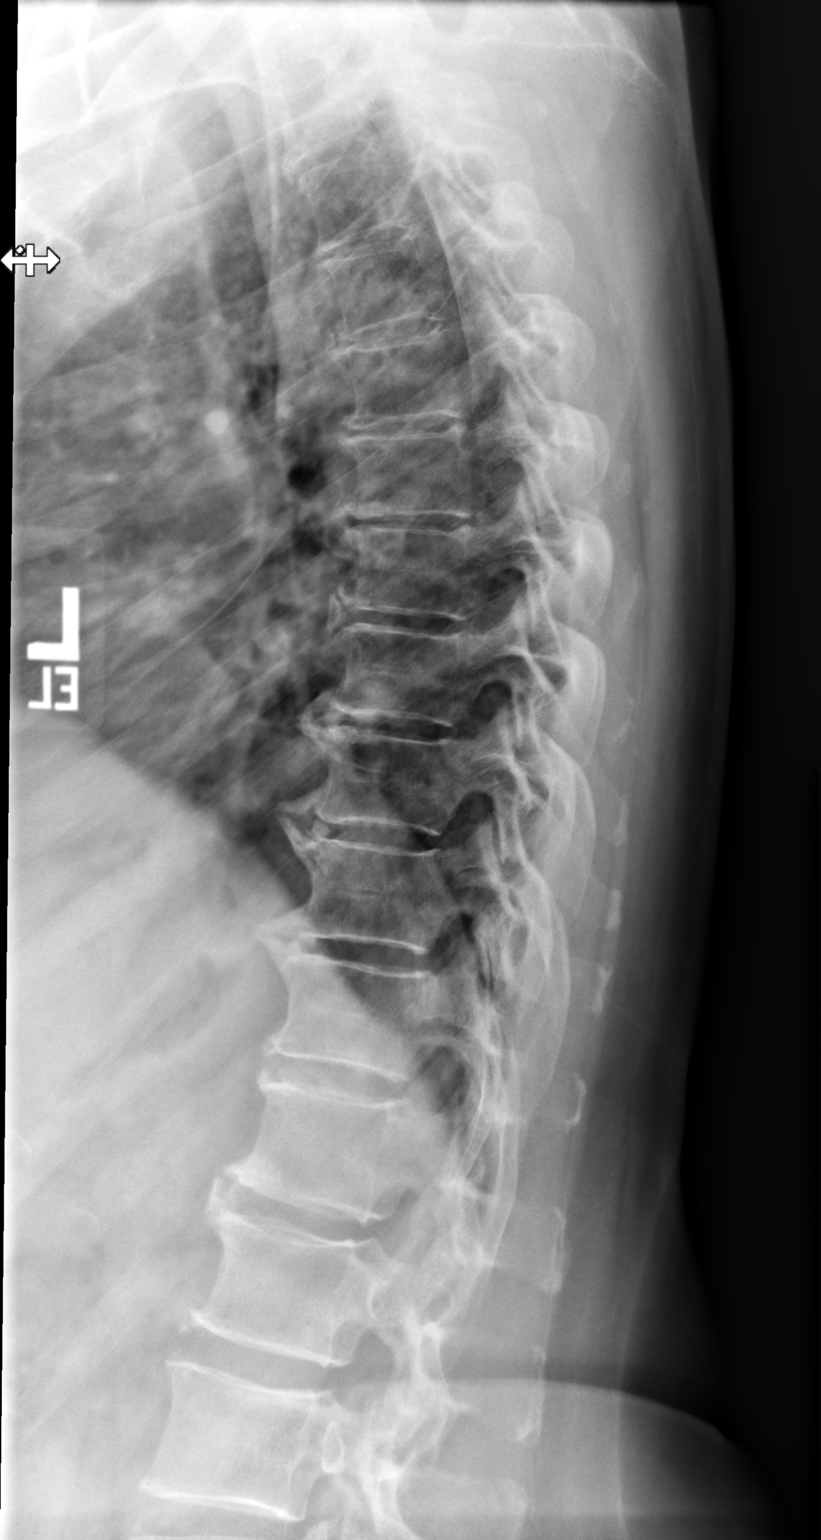

[t swimmers]
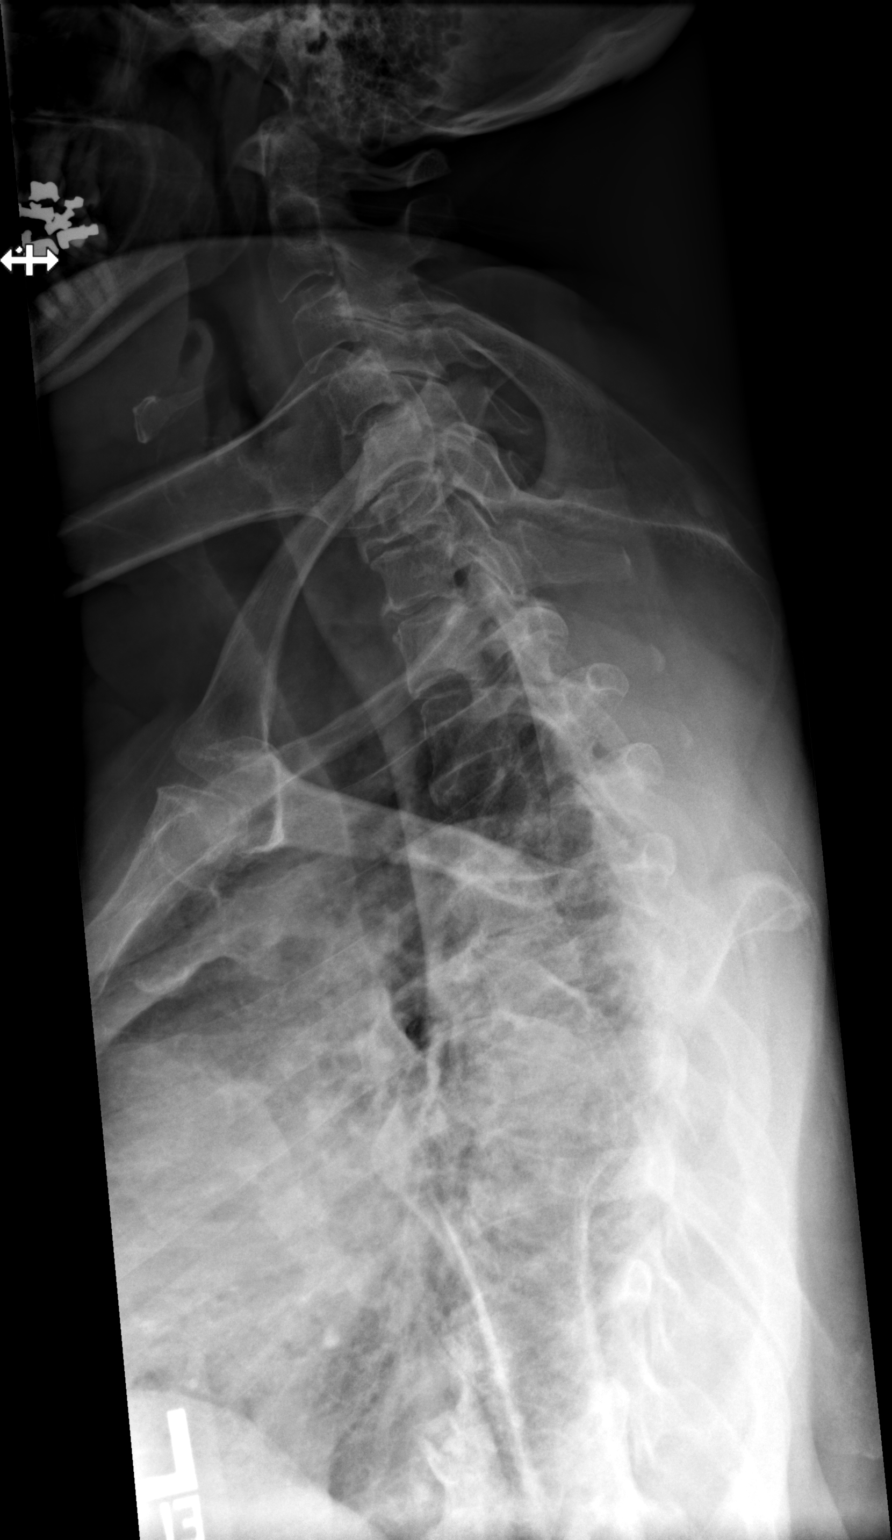

[3 of 3 positions shown; findings below may reference images not displayed]

FINDINGS: Moderate multilevel degenerative disc and spondylosis change. No subluxation or wedge compression deformity. Normal mineralization.
IMPRESSION: Moderate thoracic degenerative change.

## 2021-10-11 IMAGING — CR L-SPINE 4 VWS MIN
5 series · 5 of 5 positions shown · non-contrast
Comparison: None.

Images Obtained from [facility]
HISTORY: Low back pain.
TECHNIQUE: Lumbar spine 5 views

[t lumbar spine ap]
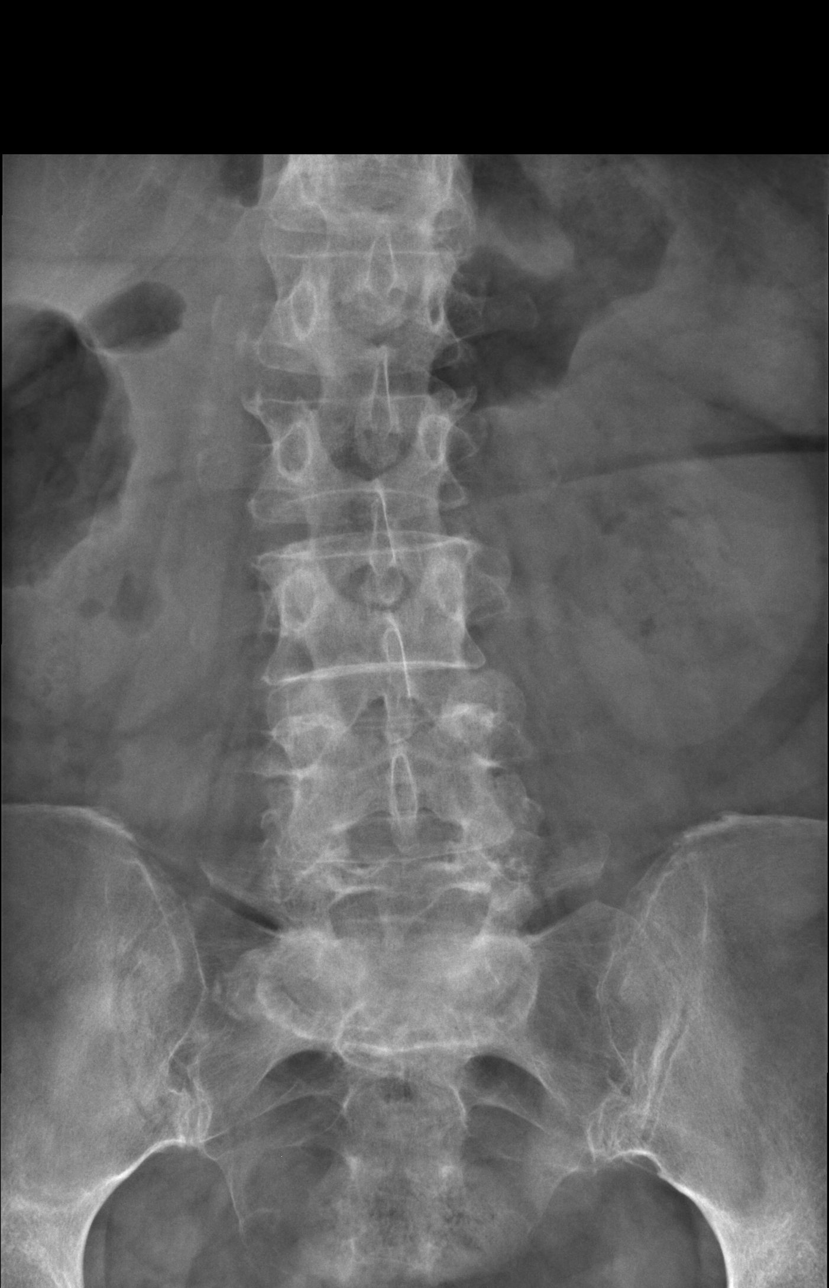

[t lumbar spine obl (1 of 2)]
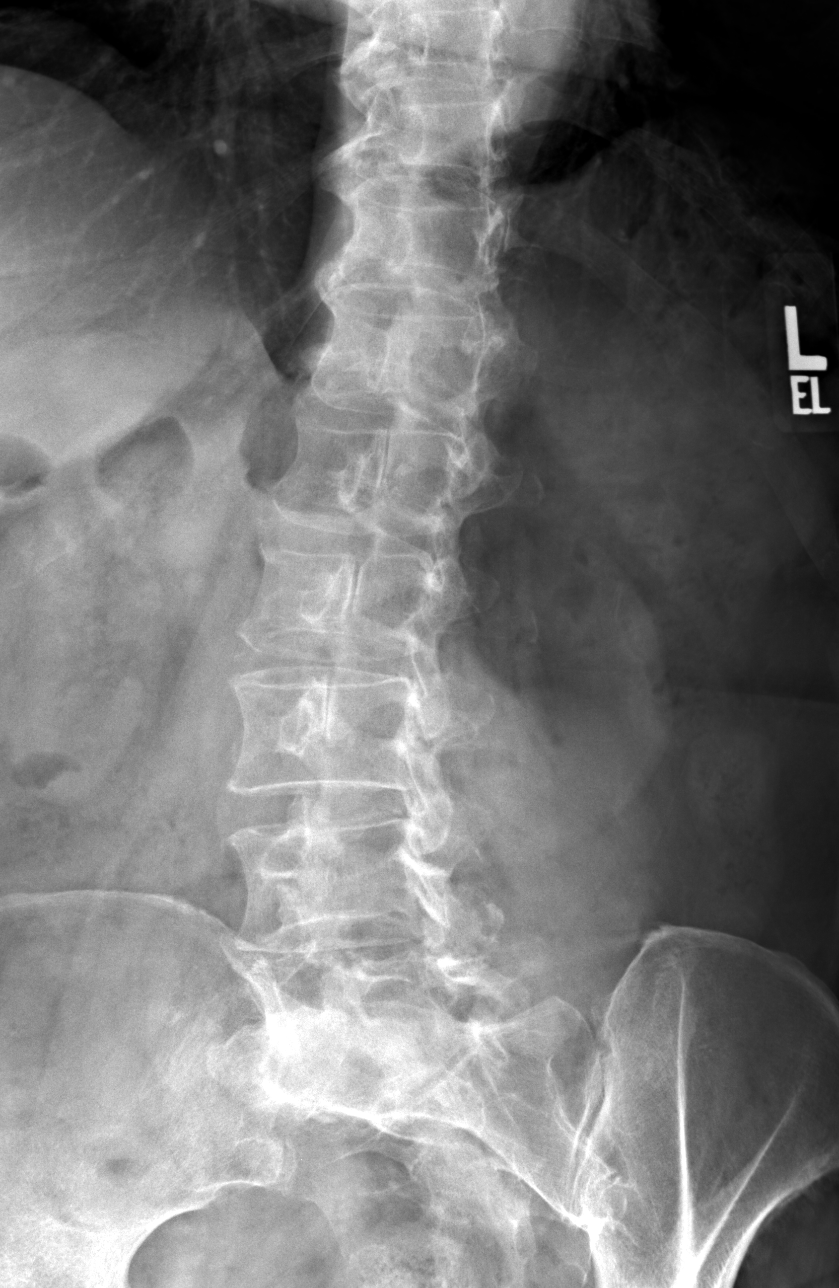

[t lumbar spine obl (2 of 2)]
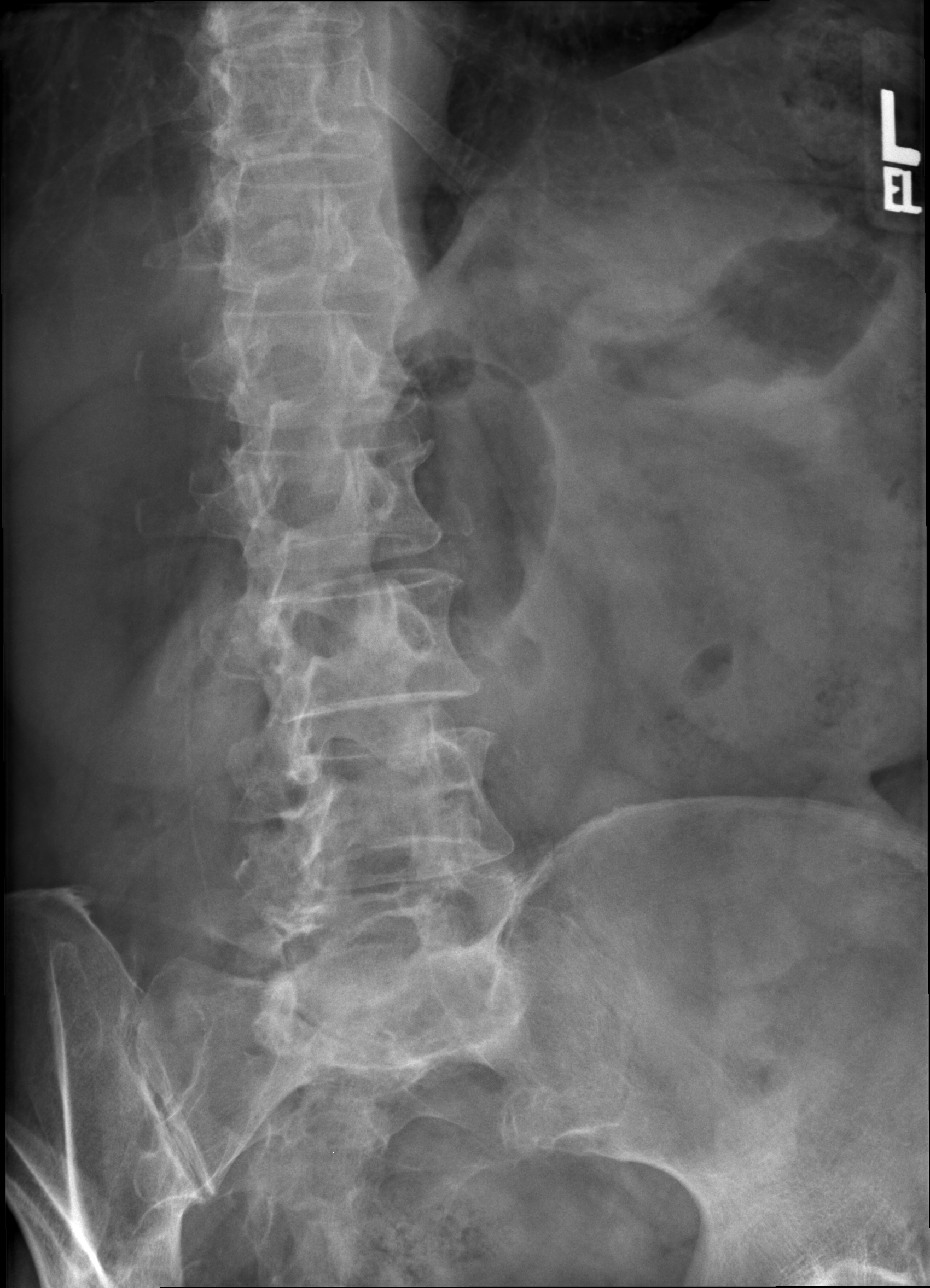

[t lumbar spine lat]
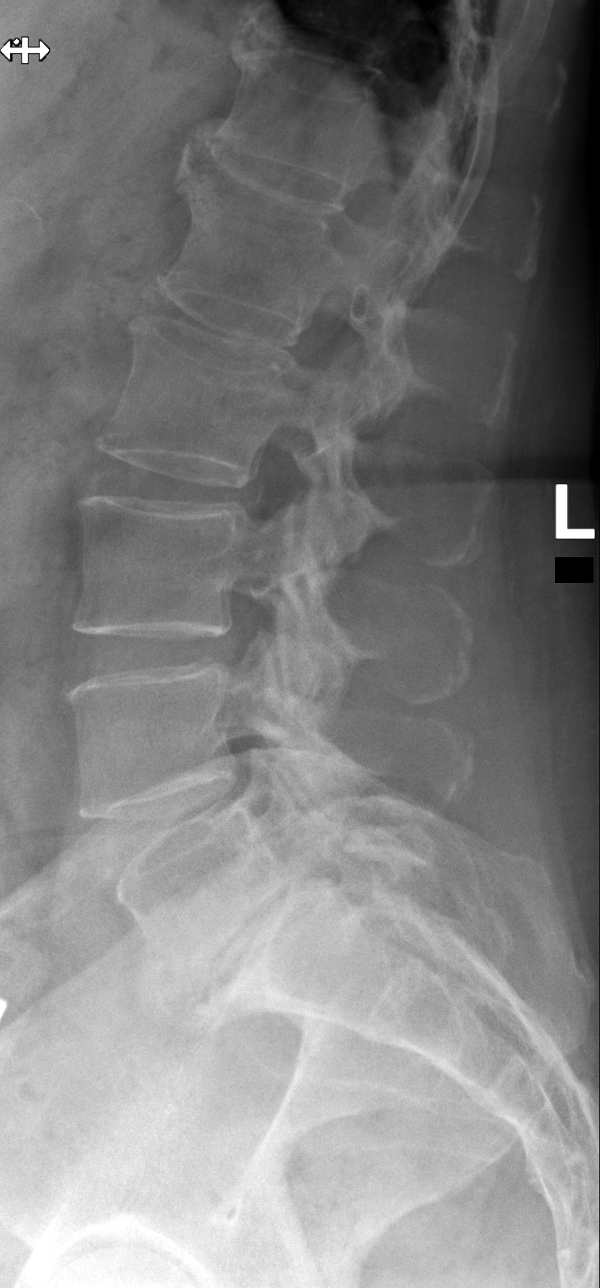

[t lumbar l-5 s-1 spot]
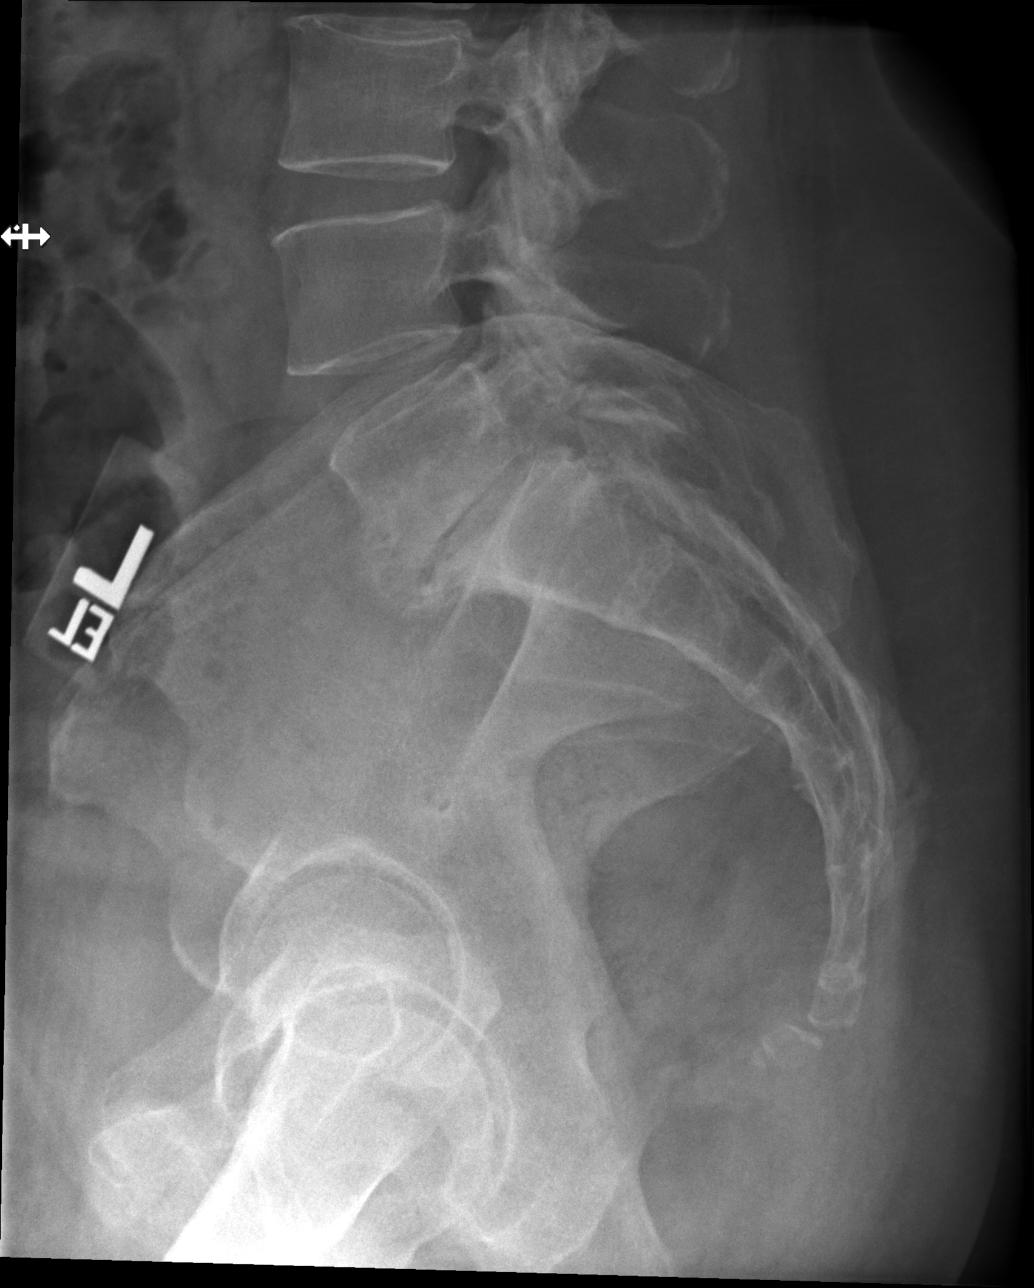

[5 of 5 positions shown; findings below may reference images not displayed]

FINDINGS: 5 views lumbar spine demonstrate total of 5 lumbar segments. Short left T12 rib. Mild thoracolumbar dextroscoliosis. No anterolisthesis or retrolisthesis. Generalized demineralization. No
fractures. Multilevel degenerative loss of disc space height being advanced at L5-S1. Multilevel facet arthropathy, most advanced at L5-S1. Mild degenerative changes of the SI joints. Paravertebral
soft tissues unremarkable.
IMPRESSION: Mild thoracolumbar dextroscoliosis with associated multilevel degenerative disc disease and facet arthropathy most advanced at L5-S1. Generalized demineralization without fracture.

## 2021-11-09 IMAGING — MR MRI TSPINE WO CONTRAST
6 series · 34 of 48 positions shown · non-contrast
Comparison: Thoracic spine x-rays 10/11/2021 that show no compression fracture deformity.

Images Obtained from [facility]
INDICATION: Thoracic intervertebral disc degeneration with mid back and right flank pain.
TECHNIQUE: Sagittal multisequence, axial T2, and coronal T1-weighted images of the thoracic spine were performed without intravenous contrast.

[Series 2: t2_cor_loc · coronal · 8.0mm · 0.68mm/px · 4 of 9 slices shown]
[im 1/9]
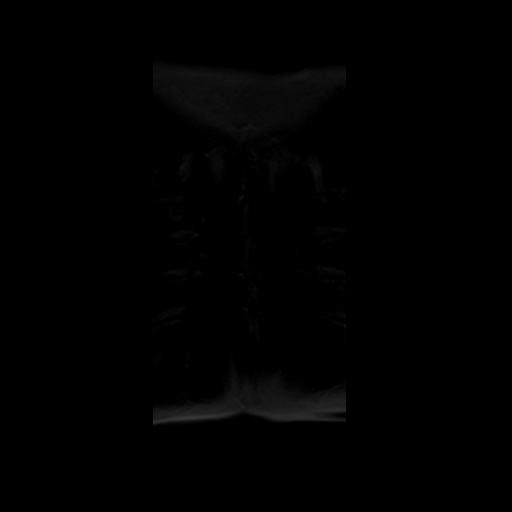
[im 3/9]
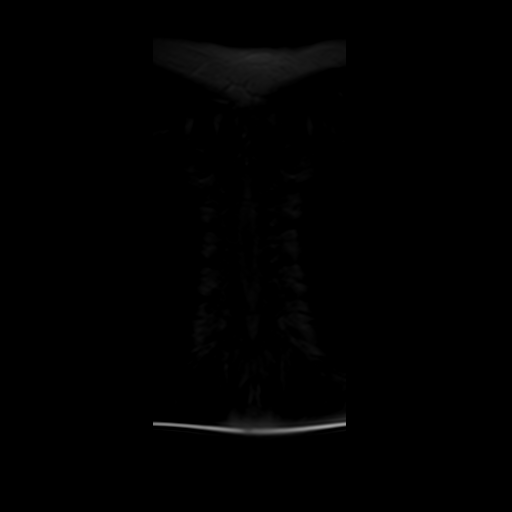
[im 6/9]
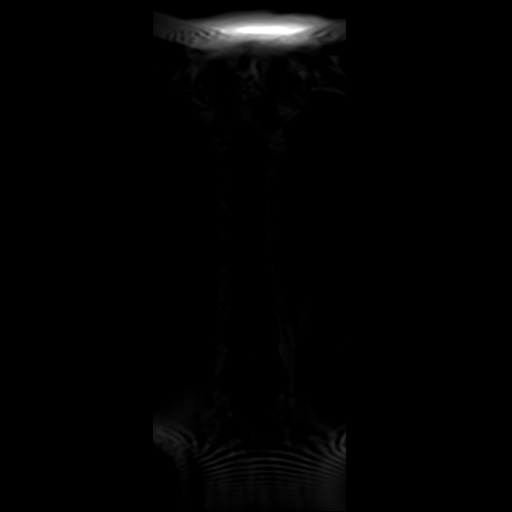
[im 9/9]
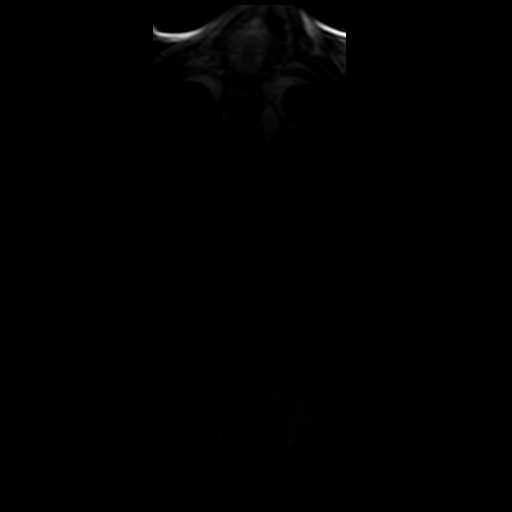

[Series 3: t2_sag · sagittal · 4.0mm · 0.81mm/px · 6 of 15 slices shown]
[im 1/15]
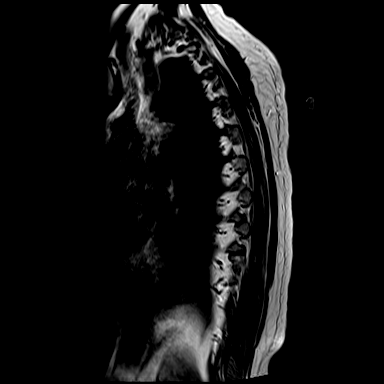
[im 3/15]
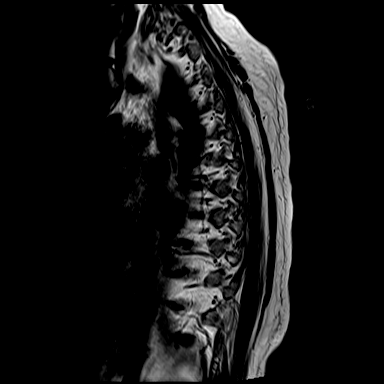
[im 6/15]
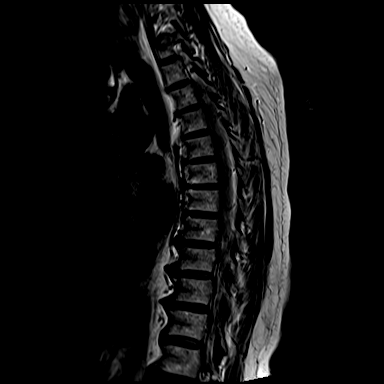
[im 9/15]
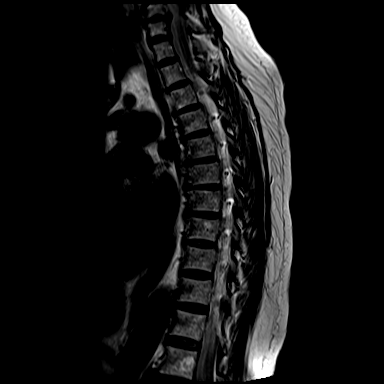
[im 12/15]
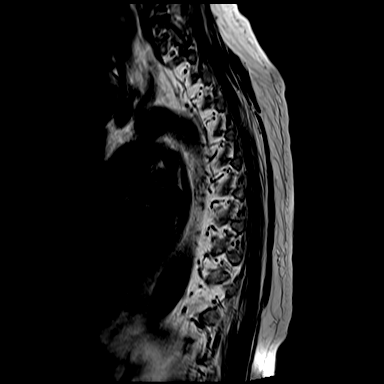
[im 15/15]
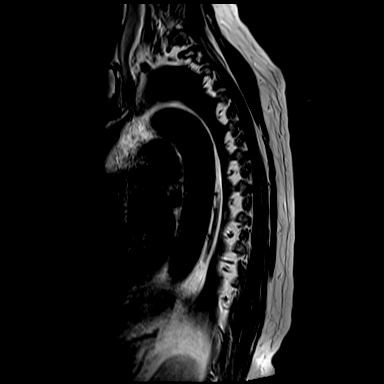

[Series 4: t1_sag · sagittal · 4.0mm · 0.97mm/px · 6 of 15 slices shown]
[im 1/15]
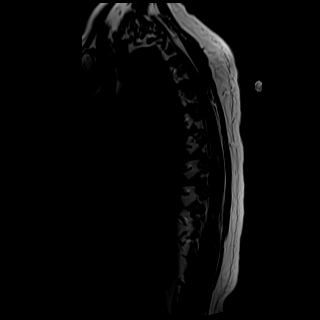
[im 3/15]
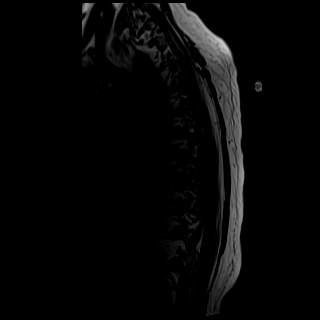
[im 6/15]
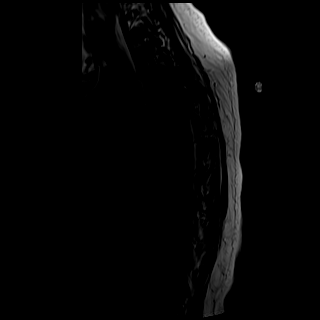
[im 9/15]
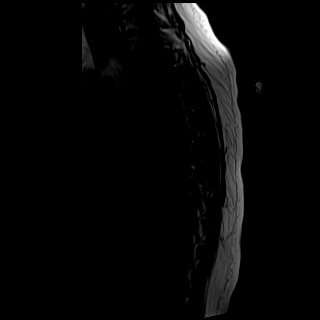
[im 12/15]
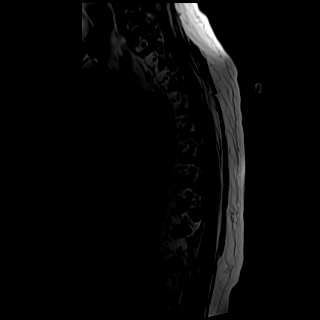
[im 15/15]
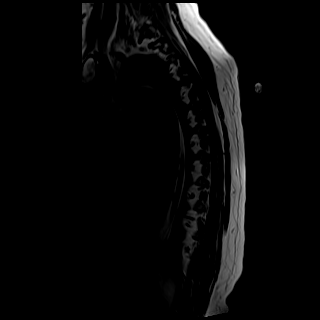

[Series 5: ir_sag · sagittal · 4.0mm · 1.21mm/px · 6 of 15 slices shown]
[im 1/15]
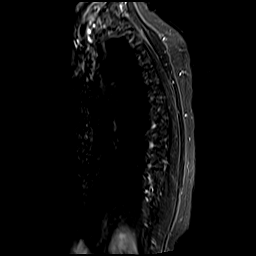
[im 3/15]
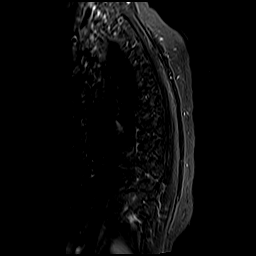
[im 6/15]
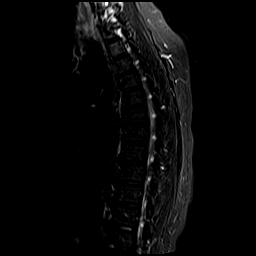
[im 9/15]
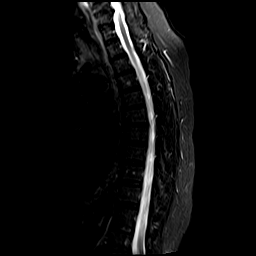
[im 12/15]
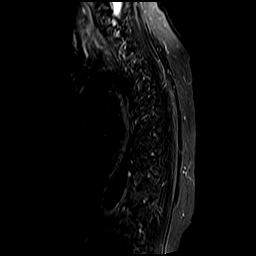
[im 15/15]
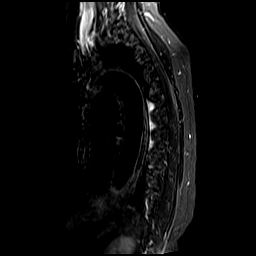

[Series 6: t2_axial_upper · axial · 4.0mm · 0.62mm/px · z∈[-192,-41]mm · 8 of 30 slices shown]
[im 1/30]
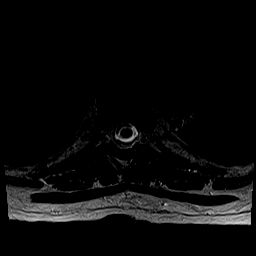
[im 5/30]
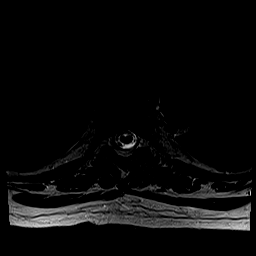
[im 10/30]
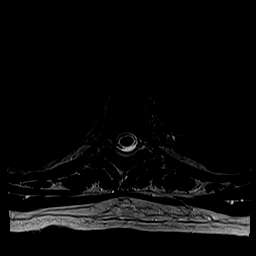
[im 13/30]
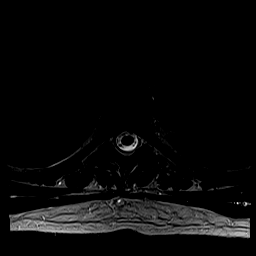
[im 17/30]
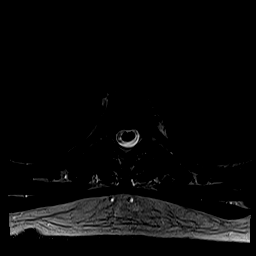
[im 20/30]
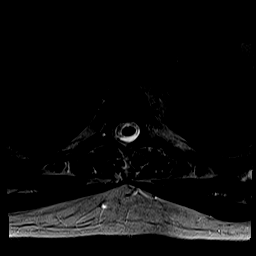
[im 25/30]
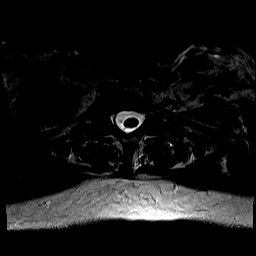
[im 30/30]
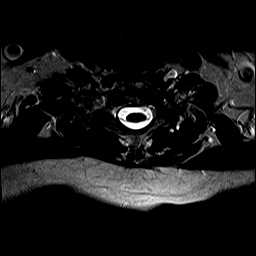

[Series 7: t2_axial_lower · axial · 4.0mm · 0.29mm/px · z∈[-315,-252]mm · 4 of 30 slices shown]
[im 1/30]
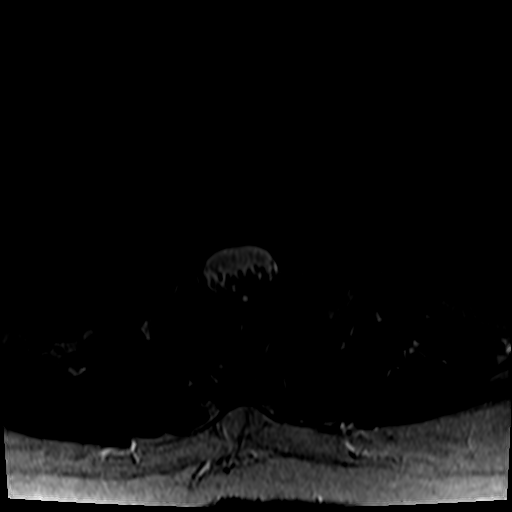
[im 5/30]
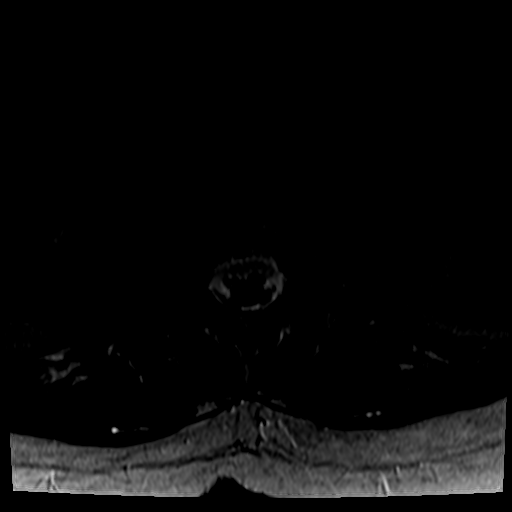
[im 10/30]
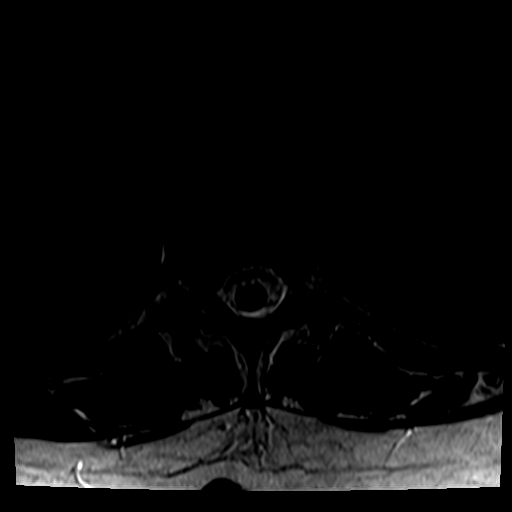
[im 13/30]
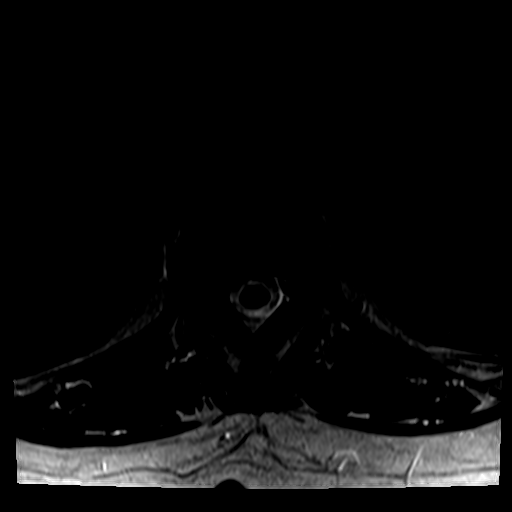

[34 of 48 positions shown; findings below may reference images not displayed]

FINDINGS: There is normal alignment of the vertebral elements. Decrease in disc space is seen throughout the thoracic spine with disc desiccation. These findings are in keeping with mild degenerative
disc disease. Scattered anterior/lateral osteophytes are seen at the mid to lower thoracic spine in keeping with spondylosis. Mild posterior epidural lipomatosis is seen, more prominent at the mid
thoracic spine. The signal intensity of the bone marrow and thoracic neural cord is normal.
At T1-T2 and T2-T3, there is no disc herniation or spinal canal or foraminal stenosis.
At T3-T4, there is a central disc bulge indenting the dural sac producing no significant spinal canal or foraminal stenosis.
At T4-T5 and T5-T6, there is no disc herniation or spinal canal or foraminal stenosis.
At T6-T7, there is a central disc protrusion with overlying osteophyte indenting the dural sac producing mild spinal canal stenosis without foraminal stenosis.
At T7-T8, T8-T9, T9-T10, T10-T11, and T11-T12, there is no disc herniation or spinal canal or foraminal stenosis. Mild facet arthropathy is seen at this level.
IMPRESSION: Spondylosis, degenerative disc disease, and facet arthropathy of the thoracic spine. No significant spinal canal or foraminal stenosis is seen.

## 2021-11-09 IMAGING — MR MRI LSPINE WO CONTRAST
5 of 6 series · 28 of 48 positions shown · non-contrast
Comparison: Correlation is made to previous lumbar spine x-rays October 11, 2021 that show 5 lumbar-type vertebrae with short left T12 rib.

Images Obtained from [facility]
INDICATION: Lumbar intervertebral disc degeneration with low back pain.
TECHNIQUE: Sagittal and axial multisequence MR images of the lumbar spine were performed without intravenous contrast.

[Series 1: bSSFP · axial · 8.0mm · 1.37mm/px · z∈[-61,+175]mm · 9 of 25 slices shown]
[im 1/25]
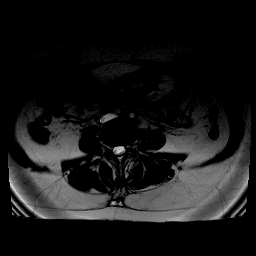
[im 4/25]
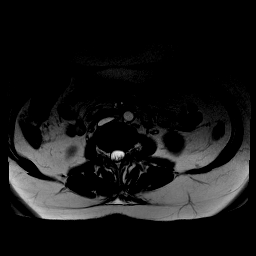
[im 7/25]
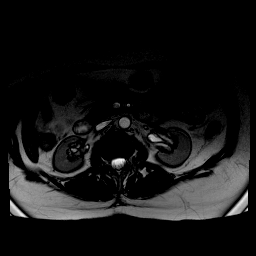
[im 10/25]
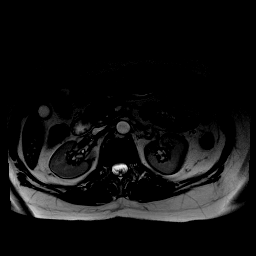
[im 13/25]
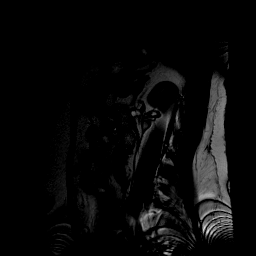
[im 16/25]
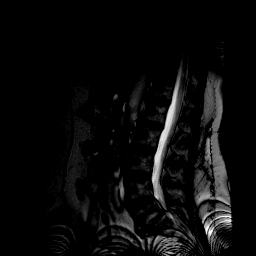
[im 19/25]
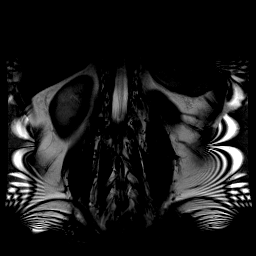
[im 22/25]
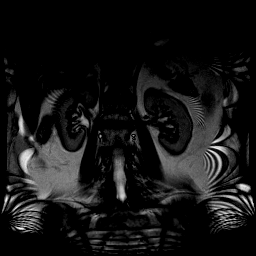
[im 25/25]
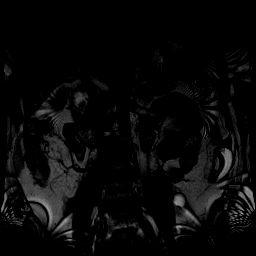

[Series 2: t2_sag · sagittal · 4.0mm · 0.39mm/px · 5 of 15 slices shown]
[im 1/15]
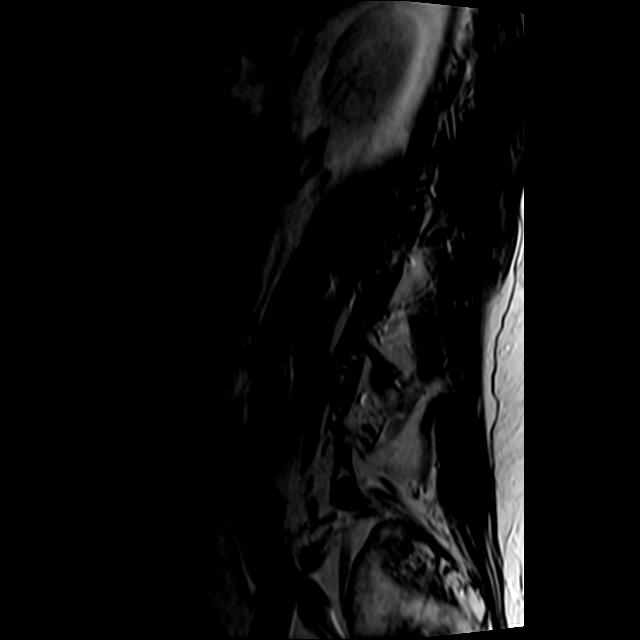
[im 4/15]
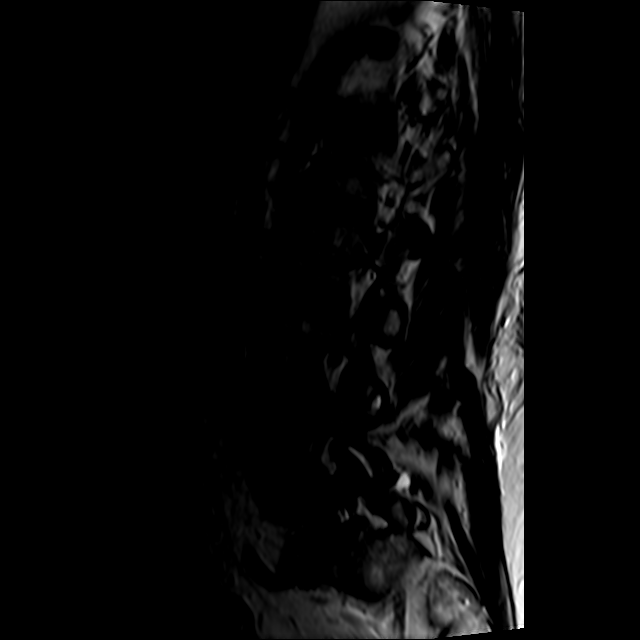
[im 8/15]
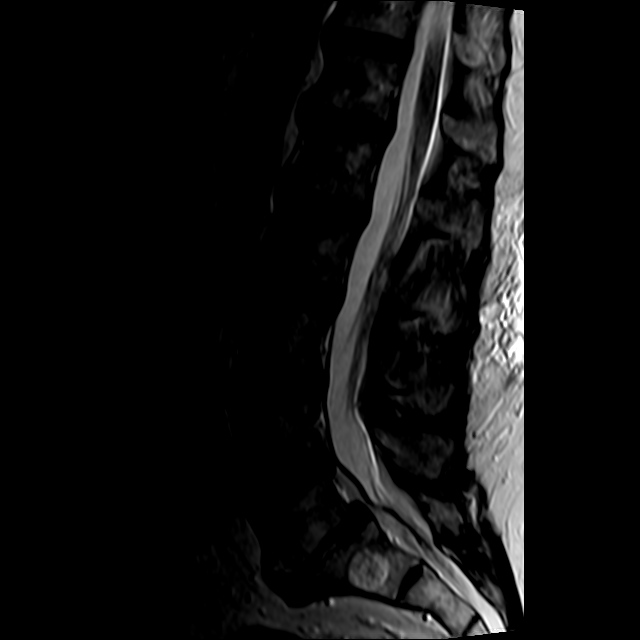
[im 11/15]
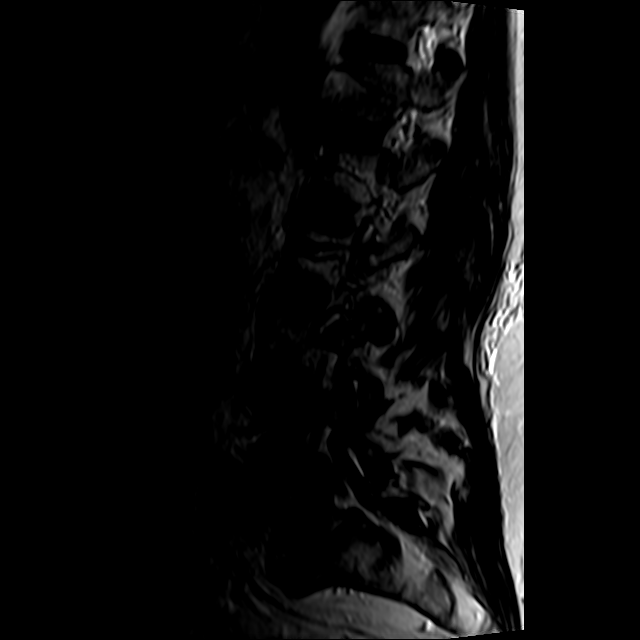
[im 15/15]
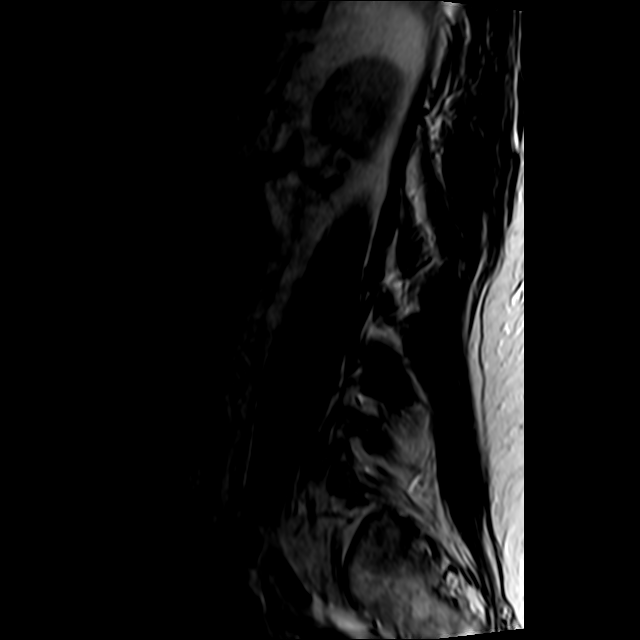

[Series 3: t1_sag · sagittal · 4.0mm · 0.78mm/px · 5 of 15 slices shown]
[im 1/15]
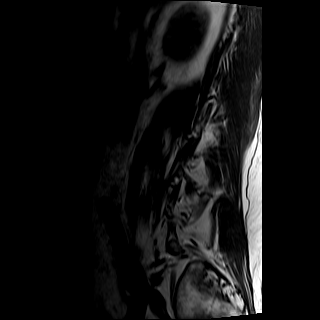
[im 4/15]
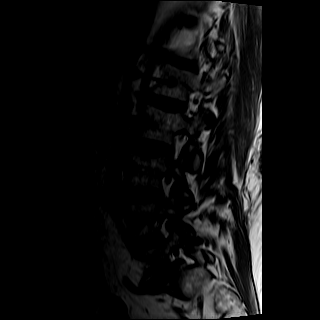
[im 8/15]
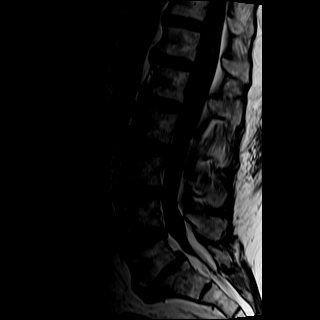
[im 11/15]
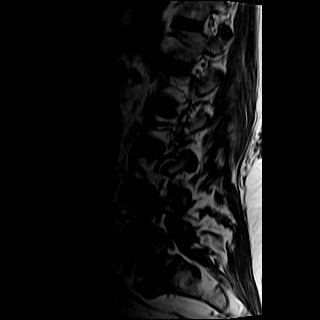
[im 15/15]
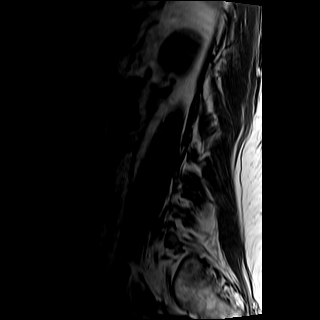

[Series 4: ir_sag · sagittal · 4.0mm · 0.49mm/px · 5 of 15 slices shown]
[im 1/15]
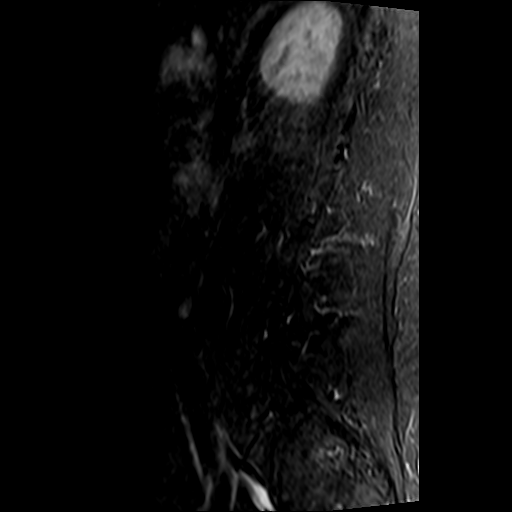
[im 4/15]
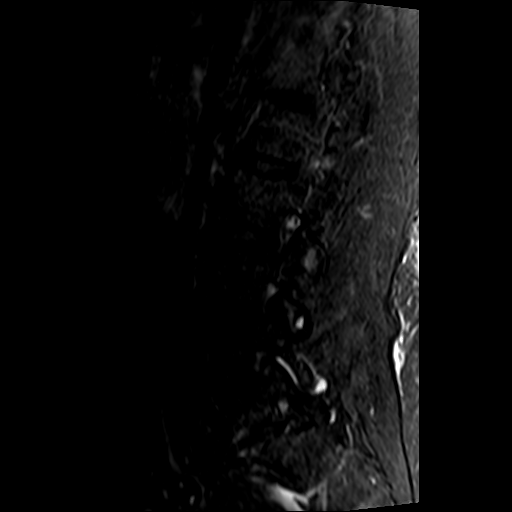
[im 8/15]
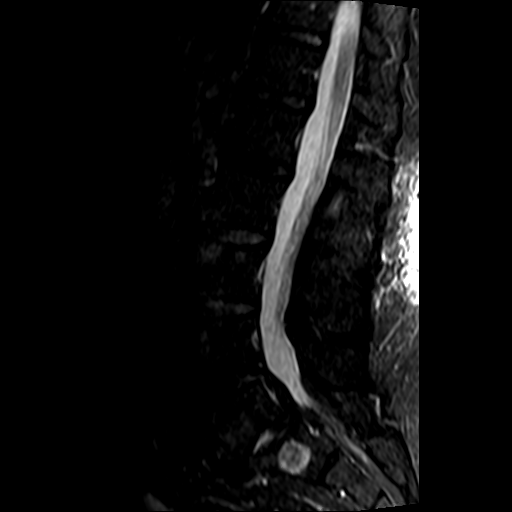
[im 11/15]
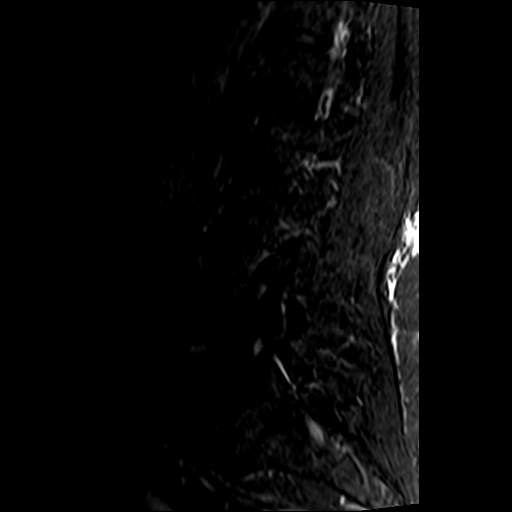
[im 15/15]
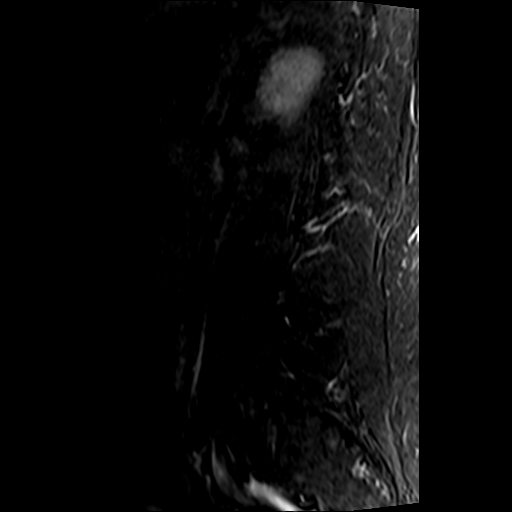

[Series 5: t1_axial_obl · axial · 4.0mm · 0.66mm/px · z∈[-184,-129]mm · 4 of 29 slices shown]
[im 1/29]
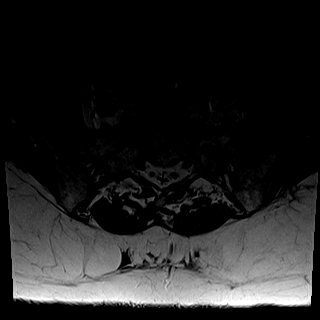
[im 4/29]
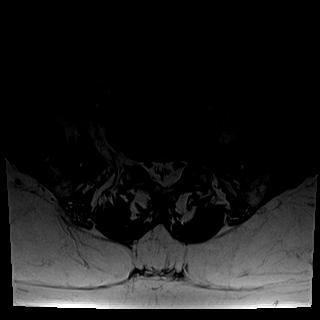
[im 10/29]
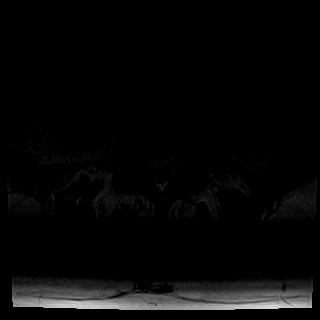
[im 13/29]
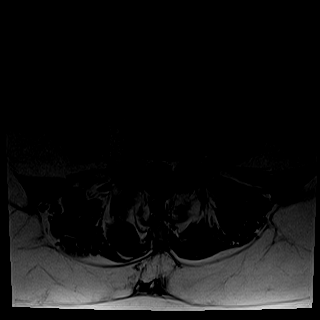

[28 of 48 positions shown; findings below may reference images not displayed]

FINDINGS: There is increase in the lumbar lordosis with mild grade 1 degenerative anterolisthesis of L5 over S1 secondary to bilateral facet arthropathy. Disc desiccation is seen throughout the
visualized lower thoracic and lumbar spine with decrease in disc space seen at L5-S1 with degenerative endplate changes. These findings are in keeping with degenerative disc disease. There is an S1
vertebral body hemangioma. The signal intensity of the bone marrow and distal neural cord is normal with normal conus ending at the superior endplate of S1 vertebral body. The cauda equina is normal.
Spinal canal is of normal caliber.
From T11-T12 down to L2-L3 there is no disc herniation, spinal canal or foraminal stenosis.
At L3-L4 there is a disc bulge with mild facet arthropathy producing no significant spinal canal or foraminal stenosis.
At L4-L5 there are bilateral degenerative changes of the facets more prominent in the left side with bilateral posterior facet synovial cyst. No spinal canal or foraminal stenosis is seen.
At L5-S1 there are bilateral degenerative changes of the facets with mild degenerative anterolisthesis of L5 over S1 with broad disc bulge and epidural lipomatosis seen from the superior endplate of
L5 down to the sacral spinal canal. This produces severe segmental spinal canal stenosis with mild bilateral foraminal stenosis.
The sacroiliac joints are normal.
IMPRESSION: 1.  Increase in the lumbar lordosis with mild grade 1 degenerative anterolisthesis of L5 over S1 secondary to facet arthropathy.
2.  Epidural lipomatosis and/or broad disc bulge seen from the superior endplate of L5 down to the sacral spinal canal producing severe segmental spinal canal stenosis at L5-S1.

## 2021-11-14 IMAGING — OT DXA BONE DENSITY
2 series · 2 of 2 positions shown · non-contrast
Comparison: none

Images Obtained from [facility]
REASON FOR EXAM: Evaluate bone density.
RISK FACTORS:  Previous hysterectomy.
PRIOR EXAMS:  None.
METHOD:  Scans of the lumbar spine and left hip were performed using dual energy X-ray densitometry (DXA).

[Series 1: — · 1 of 1 slices shown (1 of 2)]
[im 1/1]
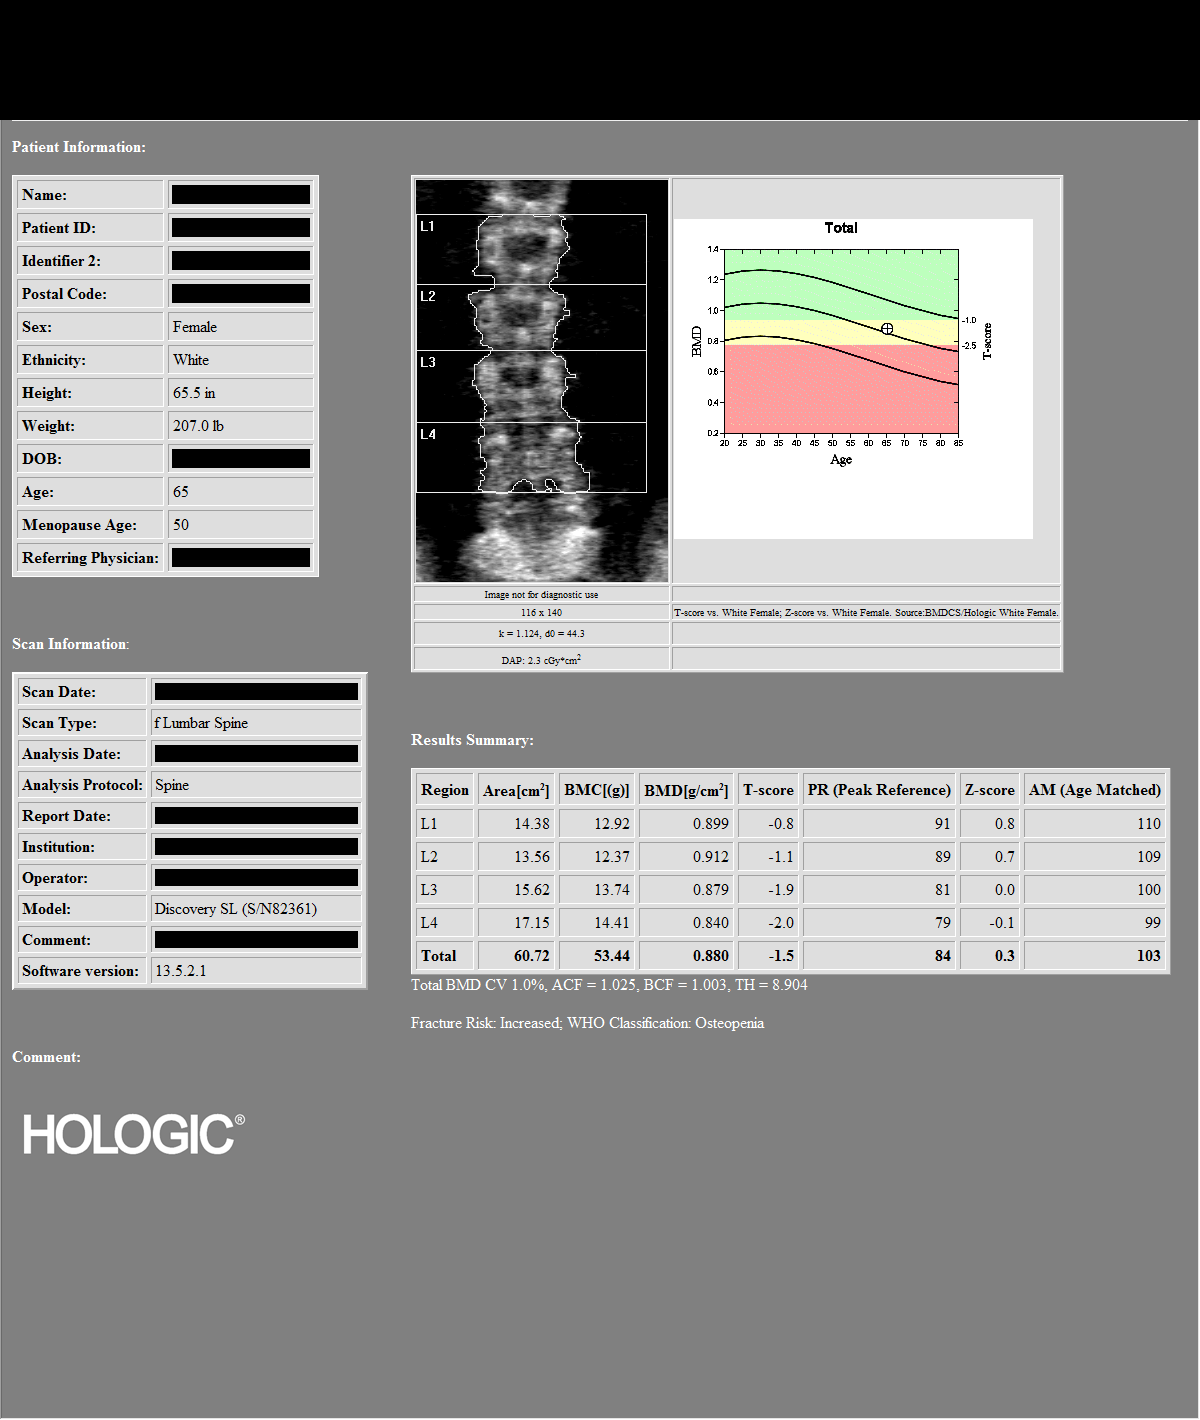

[Series 2: — · left · 1 of 1 slices shown (2 of 2)]
[im 1/1]
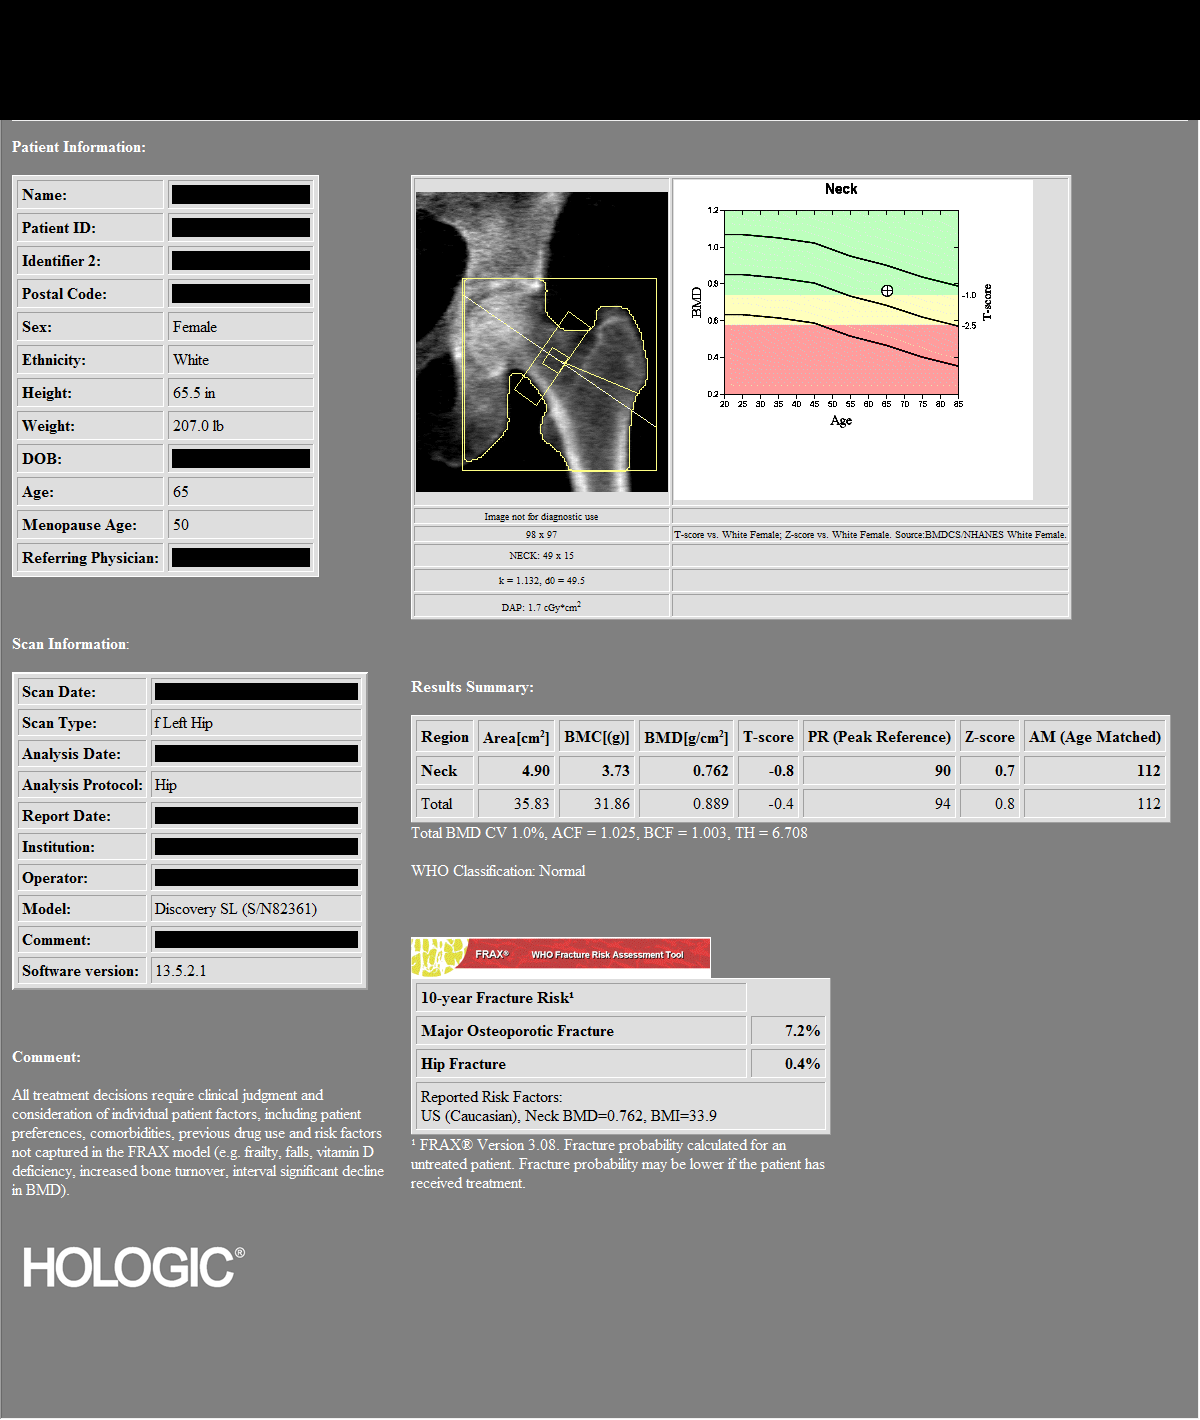

[2 of 2 positions shown; findings below may reference images not displayed]

IMPRESSION: As defined by World Health Organization, the patient meets the criteria for OSTEOPENIA based on lumbar spine T-score.
PATIENT DEMOGRAPHICS:  66-year-old white female.
FINDINGS: 1.    Review of scanogram images shows no factor invalidating scan results.
2.    The lumbar spine exam using L1-L4 regions shows average Bone Mineral Density is 0.880 gm/cm2 of Hydroxyapatite.  The T-score (comparing patient with a young adult group) is 1.5 standard
deviations BELOW mean. The Z-score (comparing patient with an age-matched group) is 0.3 standard deviations ABOVE mean.
3.  The left hip exam using femoral neck region of interest shows average Bone Mineral Density is 0.762 gm/cm2 of Hydroxyapatite. The T-score (comparing patient with a young adult group) is
standard deviations BELOW mean. The Z-score (comparing patient with an age-matched group) is 0.7 standard deviations ABOVE mean.
According to the World Health Organization risk assessment tool (FRAX) for osteopenia only, which uses the femoral neck T-score and includes other patient risk factors for fracture, the patient has a
10-year absolute risk of hip fracture of 0.4% and 10-year absolute risk fracture for any major fracture of 7.2%. Clinician's judgment and/or patient preferences may indicate treatment for people with
10-year fracture probabilities above or below these levels.
RECOMMENDATIONS:  The patient states that she is not taking vitamin D or calcium on a regular basis.  The patient should continue being a non-smoker and regular exercise to patient tolerance would be
of benefit.  The patient is currently not taking prescribed medication for prevention of bone loss.  According to criteria established by the national osteoporosis foundation, the patient DOES NOT
meet the current indications for prescribed medical therapy.
The National Osteoporosis Foundation now recommends followup DXA scanning every two years in patients at risk regardless of whether the patient is undergoing pharmacological treatment.

## 2021-11-14 IMAGING — CR SCOLIOSIS SPINE 2-3 VWS
2 series · 8 of 8 positions shown · non-contrast
Comparison: None

Images Obtained from [facility]
HISTORY/INDICATIONS:  Scoliosis screening.
TECHNIQUE: AP and lateral digital radiography scanogram studies of the spine are performed in the erect AP and lateral projection using dose reduction algorithm.

[Series 1: AP · U · 0.13mm/px · 4 of 4 slices shown]
[im 1/4]
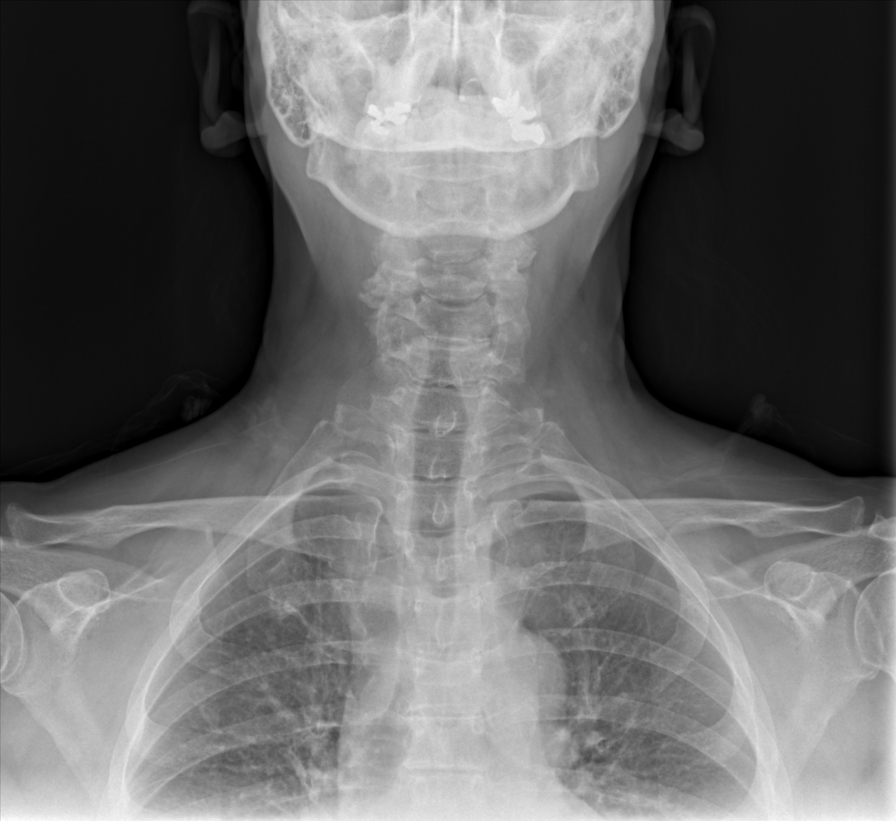
[im 2/4]
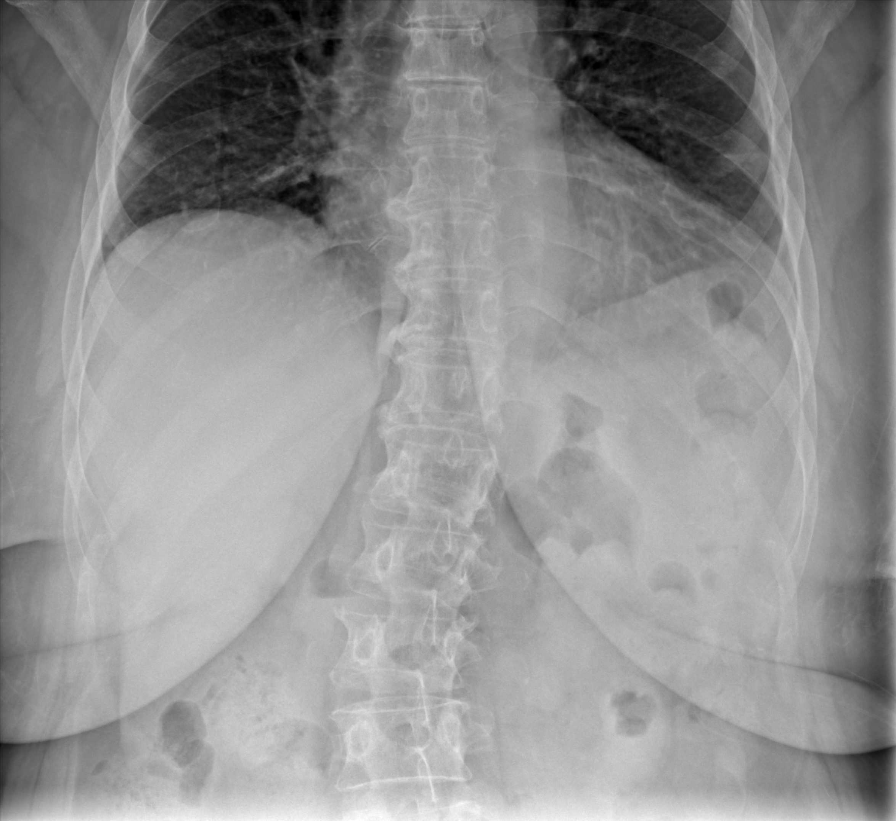
[im 3/4]
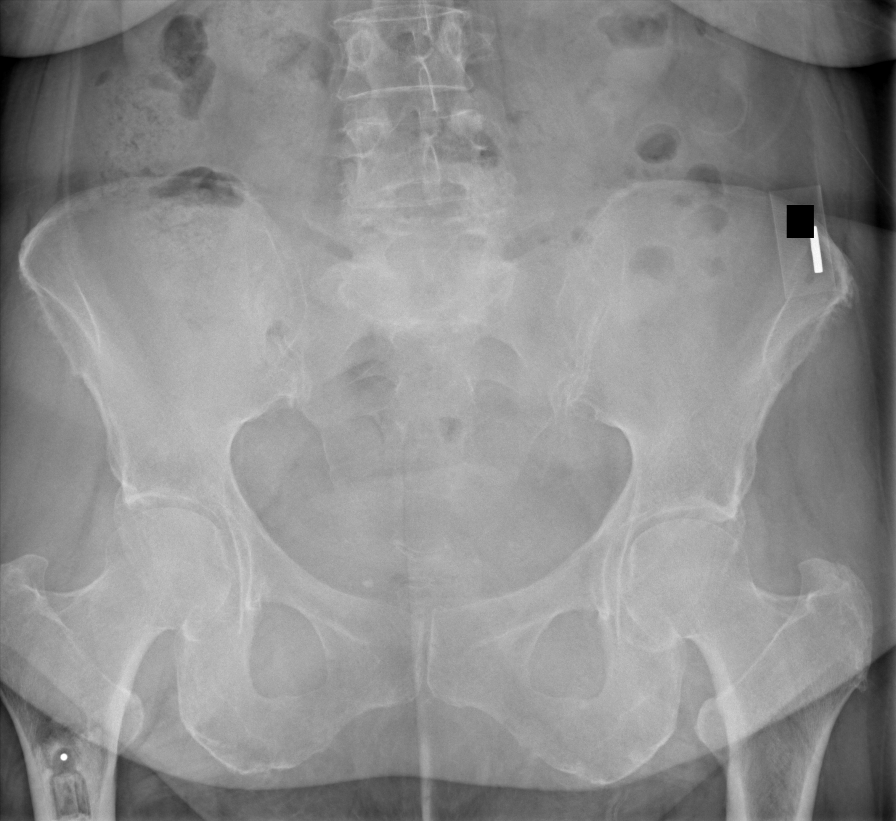
[im 4/4]
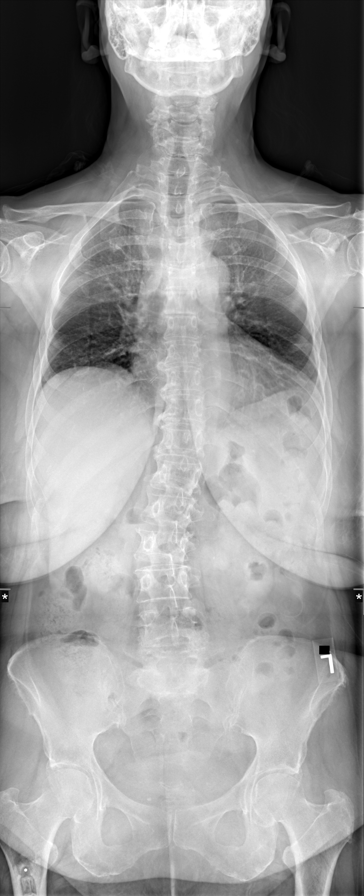

[Series 2: left lateral · U · 0.13mm/px · 4 of 4 slices shown]
[im 1/4]
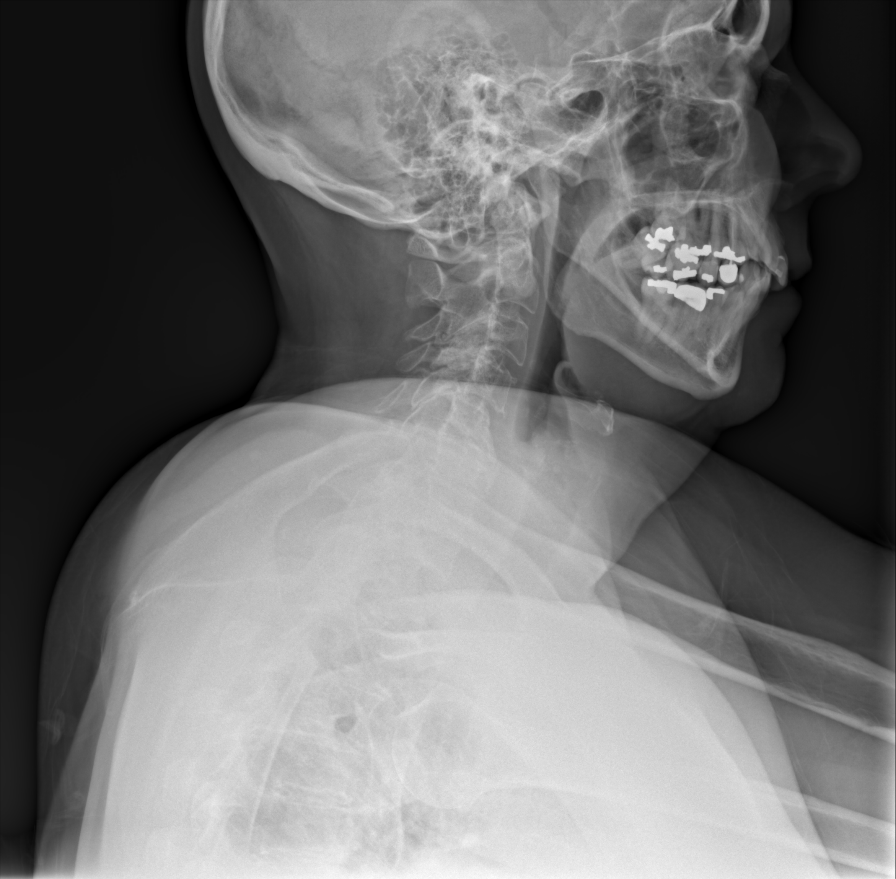
[im 2/4]
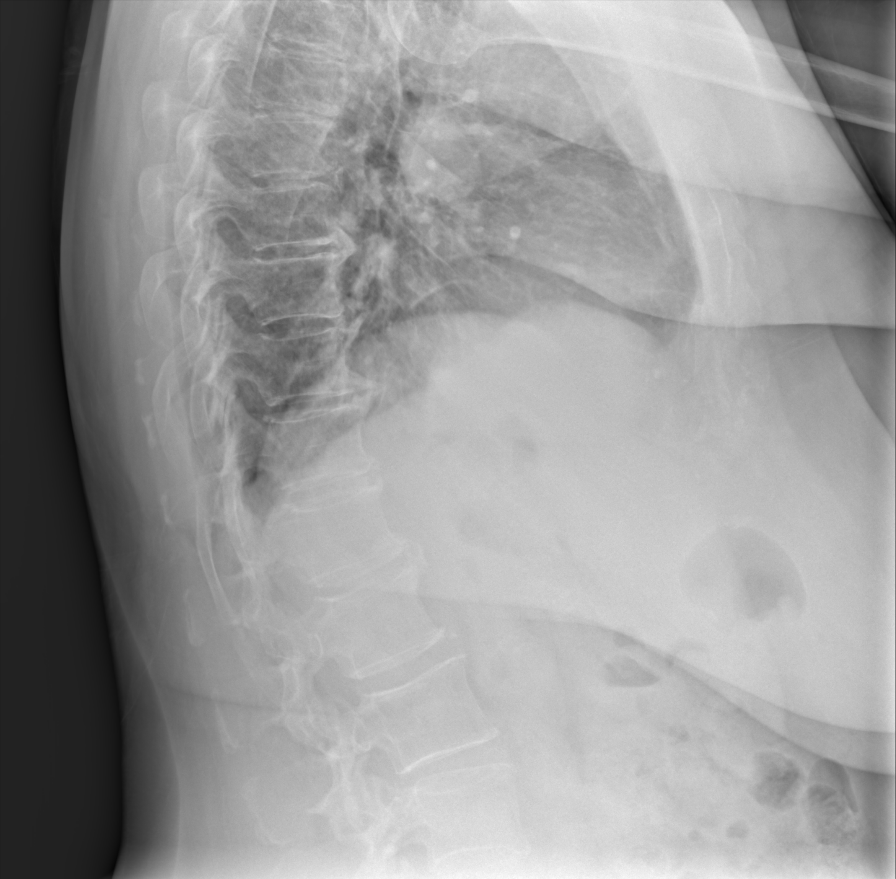
[im 3/4]
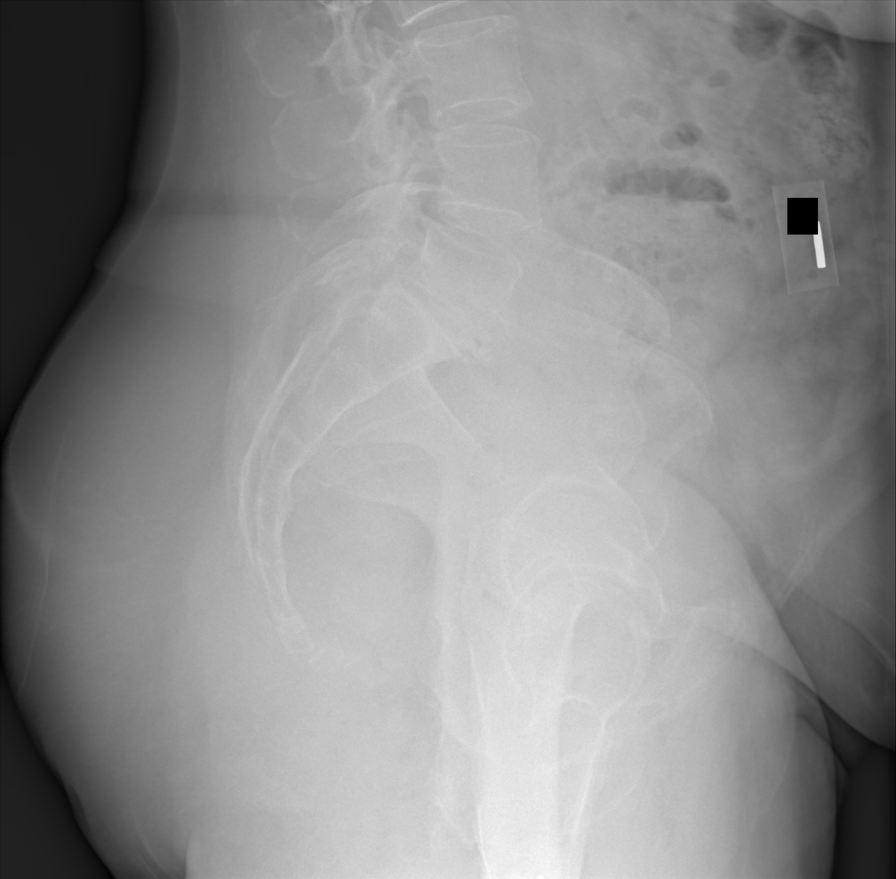
[im 4/4]
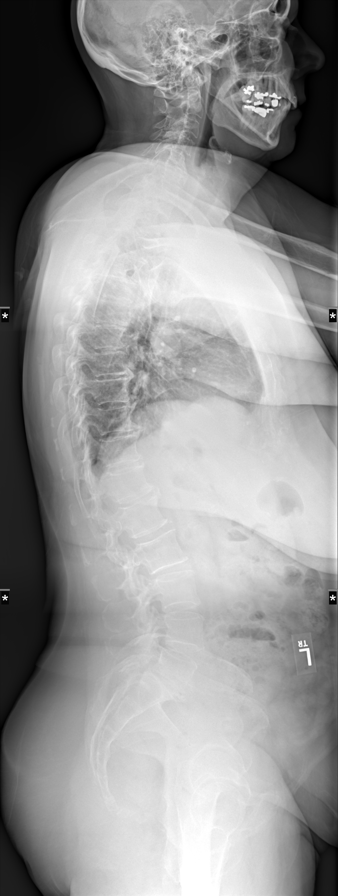

[8 of 8 positions shown; findings below may reference images not displayed]

FINDINGS: Mild cervicothoracic dextroscoliosis, 8.4 degrees, apex at T1-2.
Mild thoracolumbar levoscoliosis, 11 degrees, apex at T11-12.
Mild lumbar dextroscoliosis, 12 degrees, apex at L3.
Normal cervical lordosis, thoracic kyphosis and lumbar lordosis.
No visible acute fracture or destructive lesion.
Multilevel degenerative disc disease and facet arthropathy.
Generalized osseous demineralization.
IMPRESSION: Scoliosis as discussed above. Demineralization without fracture. Degenerative disc disease and facet arthropathy.

## 2022-03-12 IMAGING — MG MAMMO SCRN BIL W/CAD TOMO
6 of 10 series · 6 of 26 positions shown · non-contrast
Comparison: The present examination has been compared to prior imaging studies.

Images Obtained from [facility]
INDICATION: Screening.
TECHNIQUE: Bilateral 2-D digital screening mammogram was performed followed by 3-D tomosynthesis.  Current study was also evaluated with a computer aided detection (CAD) system.
MAMMOGRAM FINDINGS:
The breasts are heterogeneously dense, which may obscure small masses.
There is an oval mass in the lower inner quadrant of the right breast at middle depth.
In the left breast, no suspicious masses, calcifications or other abnormalities are seen.

[L MLO]
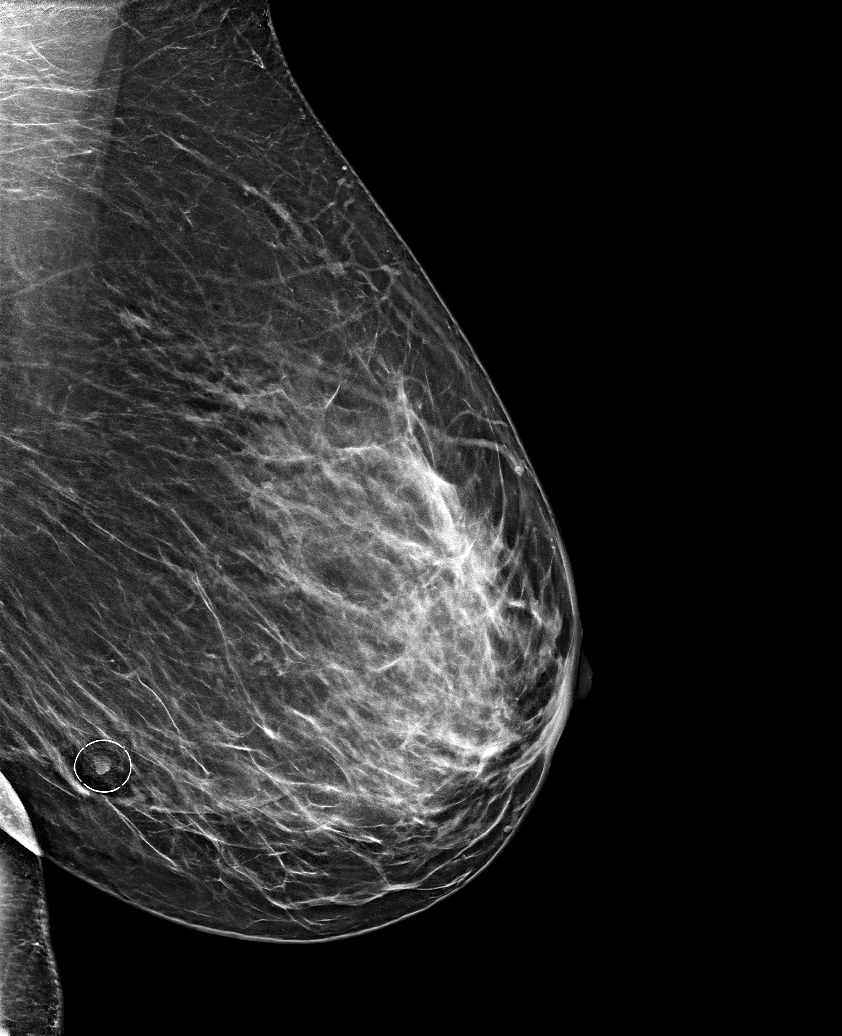

[R CC (1 of 2)]
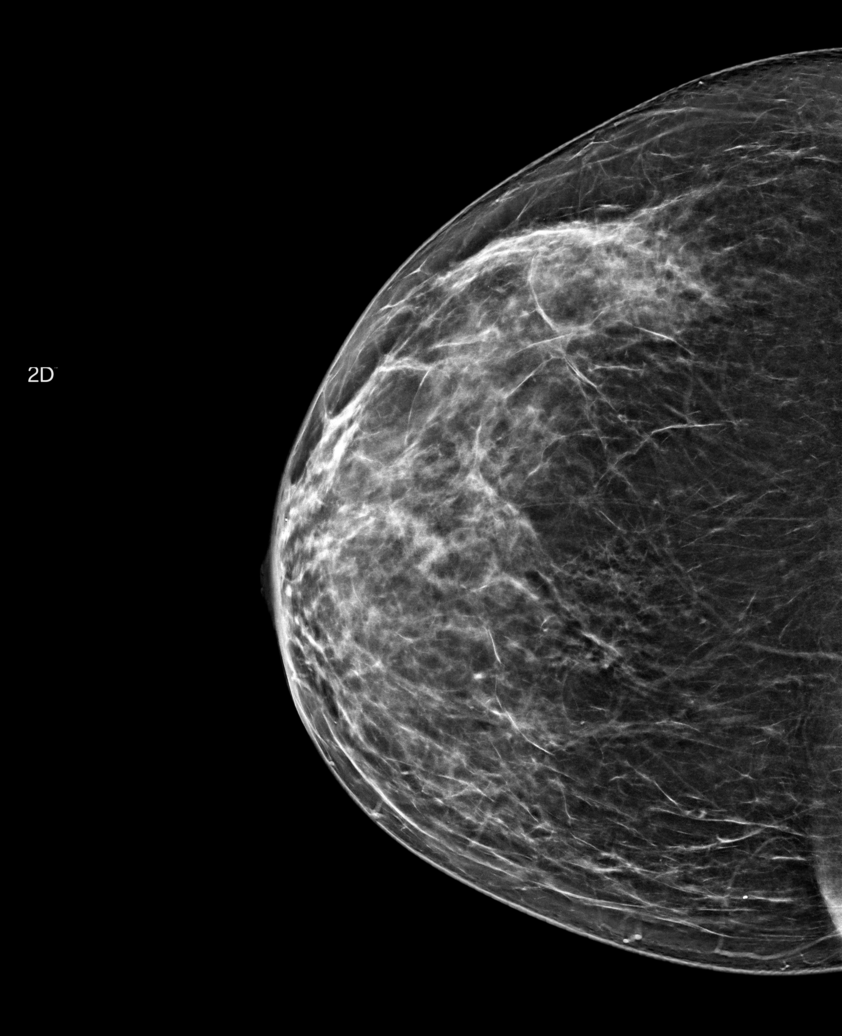

[L CC]
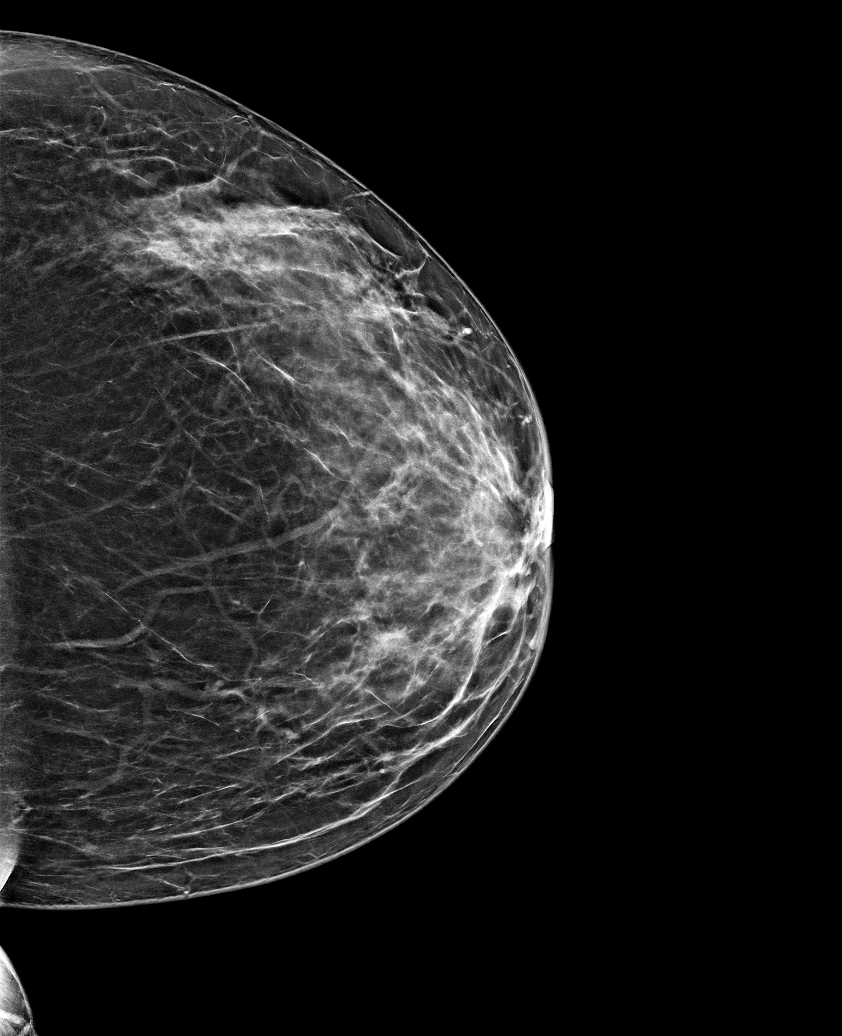

[R MLO (1 of 2)]
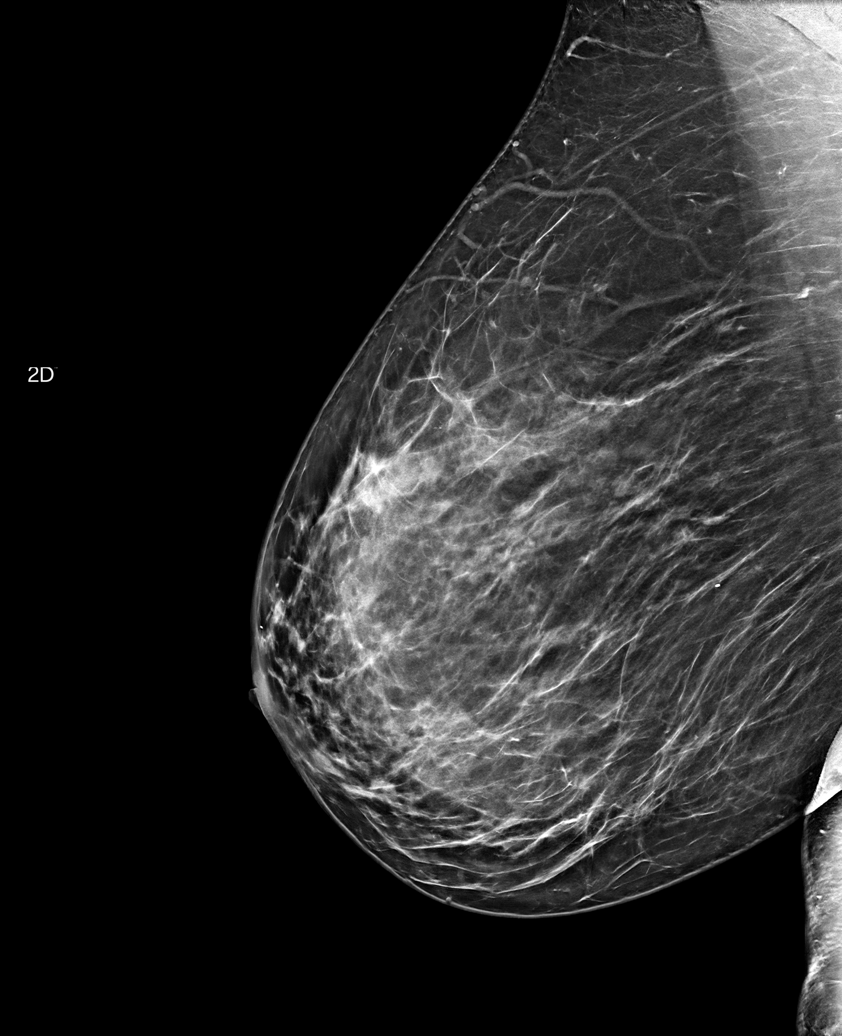

[R CC (2 of 2)]
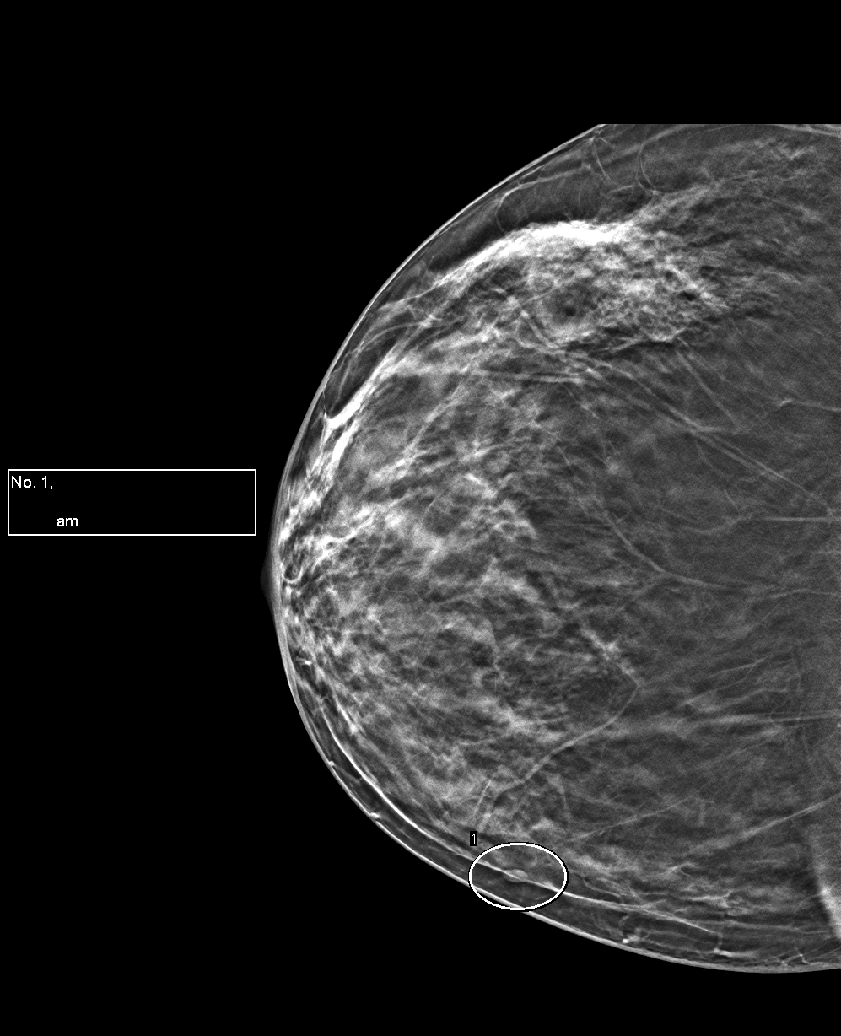

[R MLO (2 of 2)]
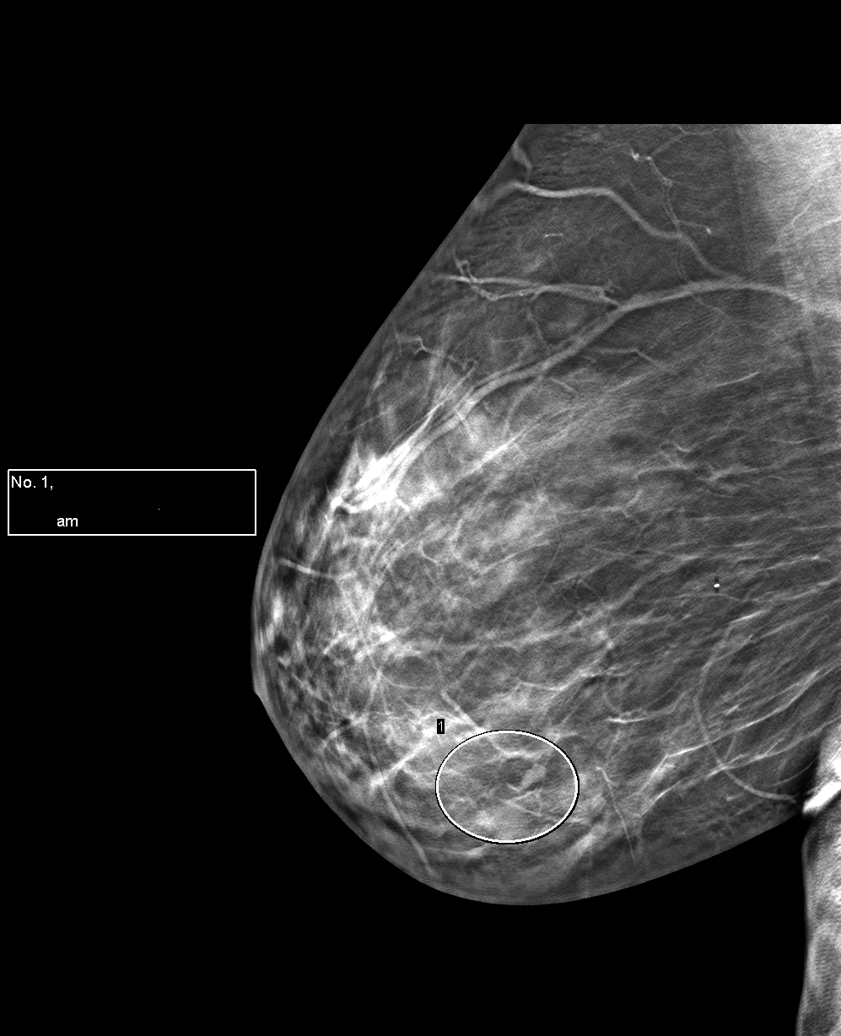

[6 of 26 positions shown; findings below may reference images not displayed]

IMPRESSION: Mass in the right breast requires additional evaluation. Ultrasound with possible diagnostic mammogram recommended.
BI-RADS Category 0: Incomplete: Needs Additional Imaging Evaluation

## 2022-03-22 IMAGING — US US BREAST RT LTD
1 series · 14 of 17 positions shown · non-contrast
Comparison: The present examination has been compared to prior imaging studies.

Images Obtained from [facility]
HISTORY: Patient is 66 years old and is seen for diagnostic evaluation of an abnormal mammogram in the right breast at 3 o'clock. The patient has no personal history of cancer. The patient does not have a
first degree relative with breast cancer.
TECHNIQUE: Real-time targeted ultrasound of the right breast was performed.
ULTRASOUND FINDINGS:
There is an oval hypoechoic avascular circumscribed 6 x 3 x 4 mm mass in the right breast at 3 o'clock located 9 centimeters from the nipple. This corresponds to finding on recent screening
mammogram. There are no enlarged right axilla lymph nodes.

[Series 1: us breast right ltd · 14 of 17 slices shown]
[im 1/17]
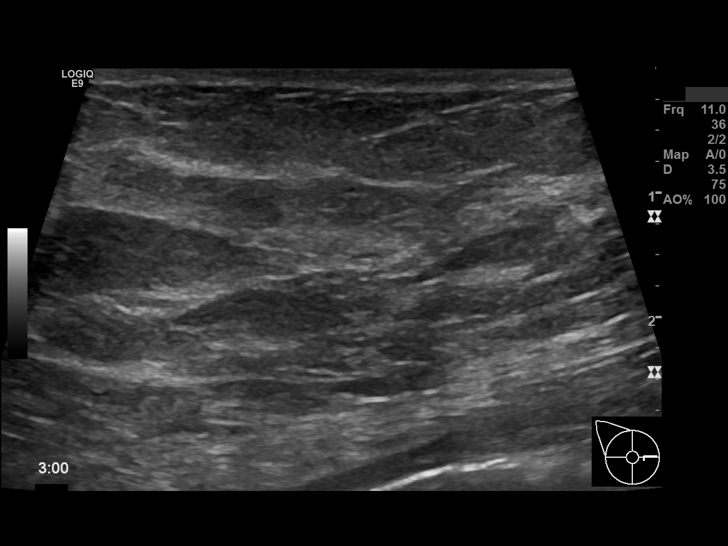
[im 2/17]
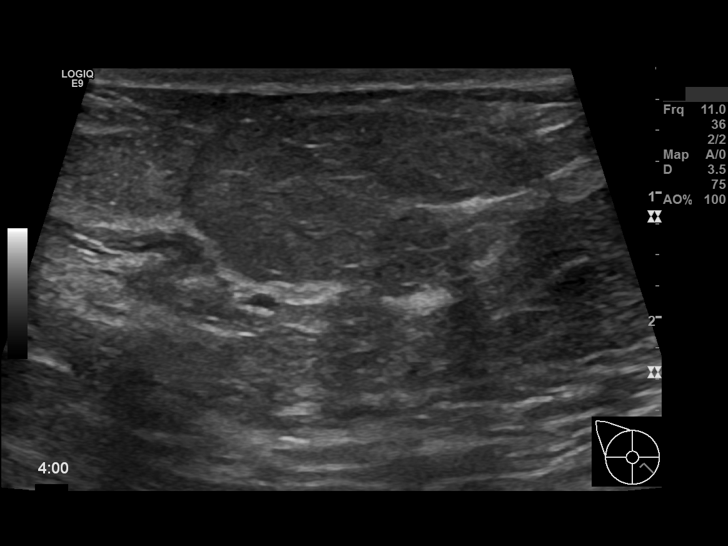
[im 4/17]
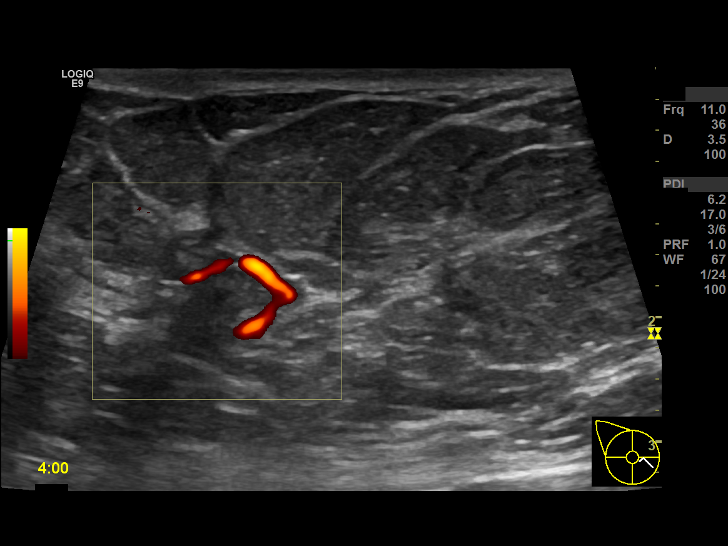
[im 5/17]
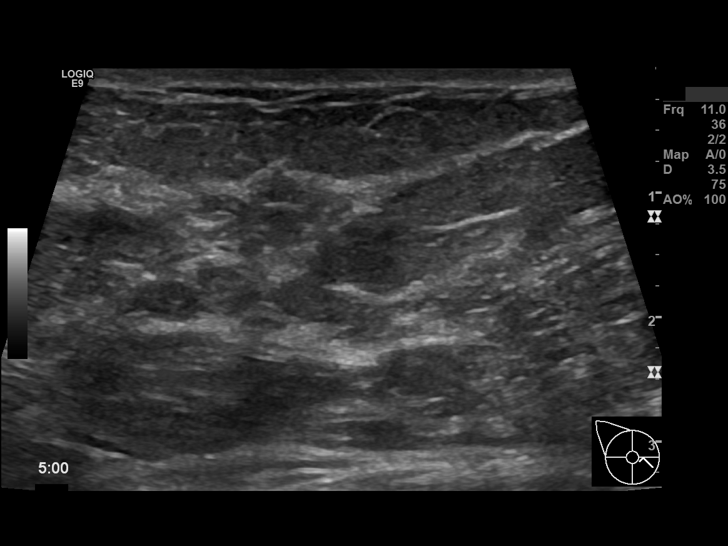
[im 6/17]
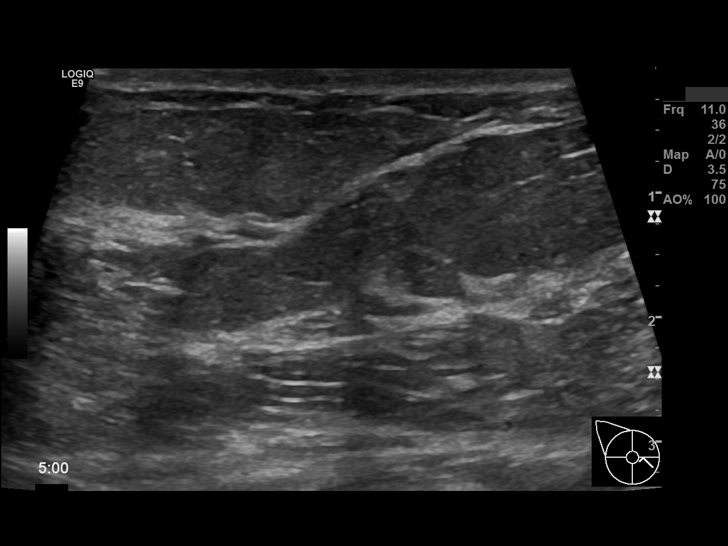
[im 7/17]
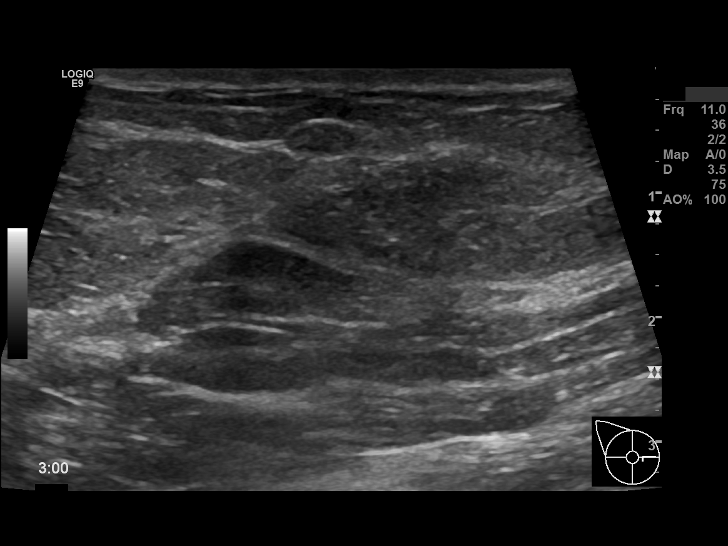
[im 8/17]
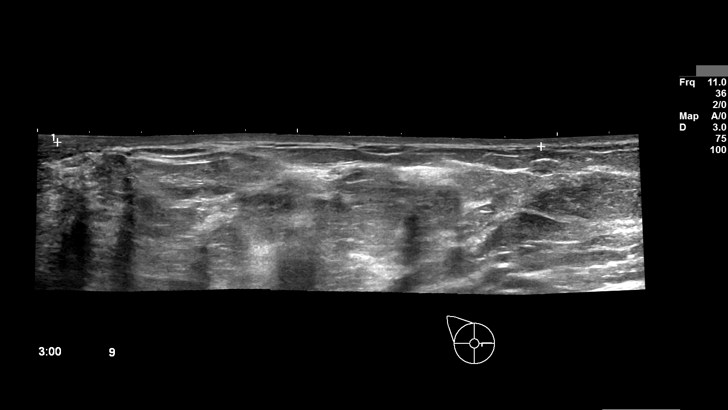
[im 10/17]
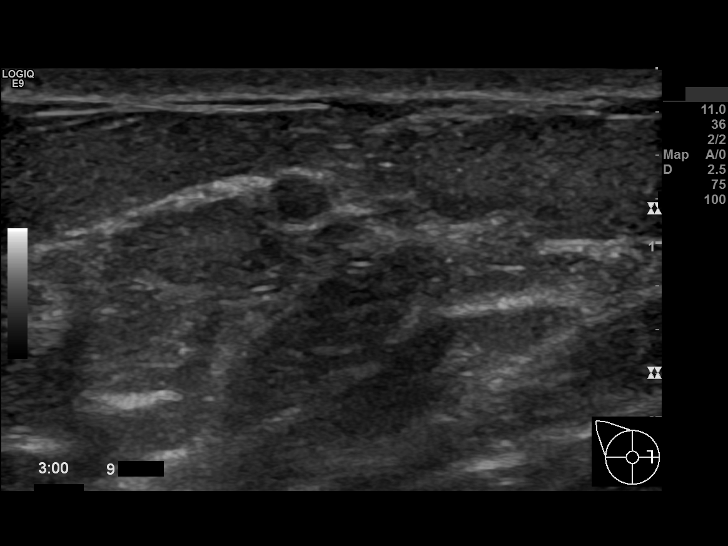
[im 11/17]
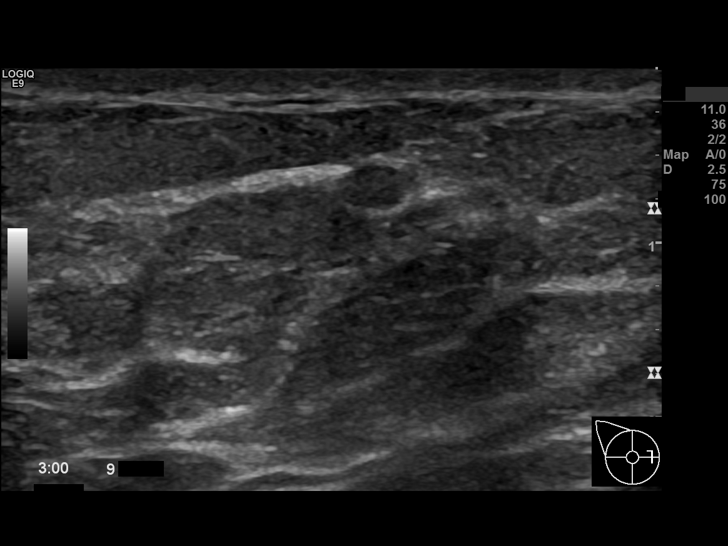
[im 12/17]
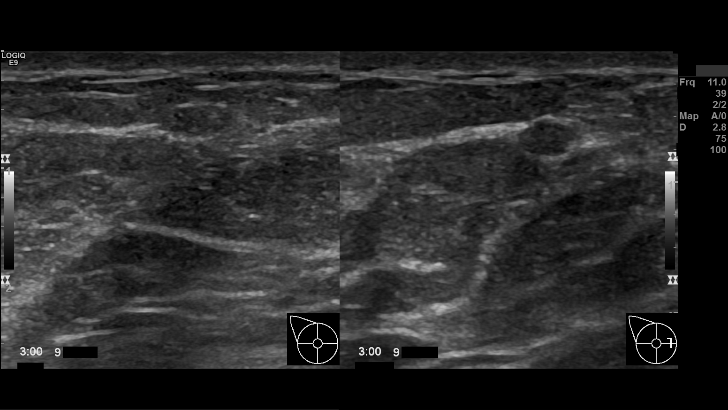
[im 13/17]
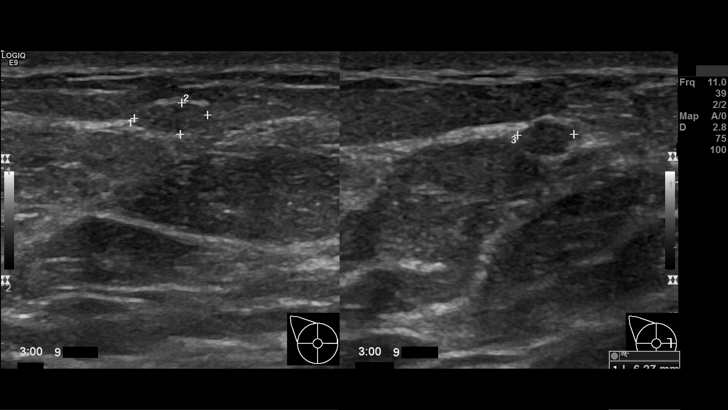
[im 14/17]
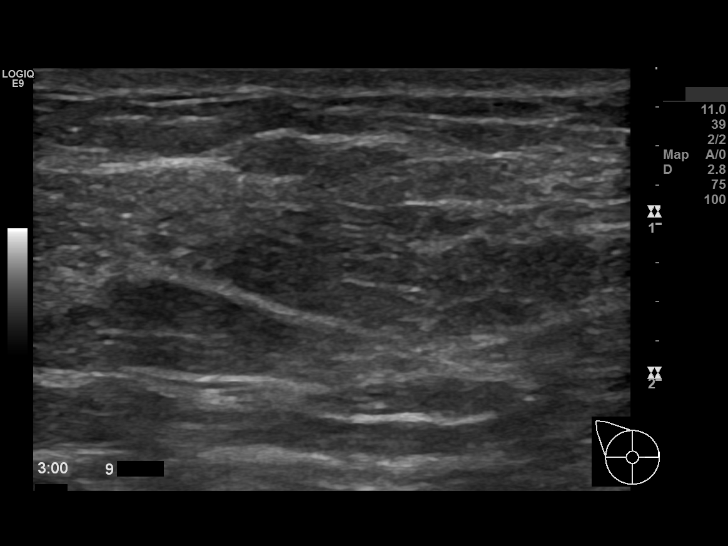
[im 16/17]
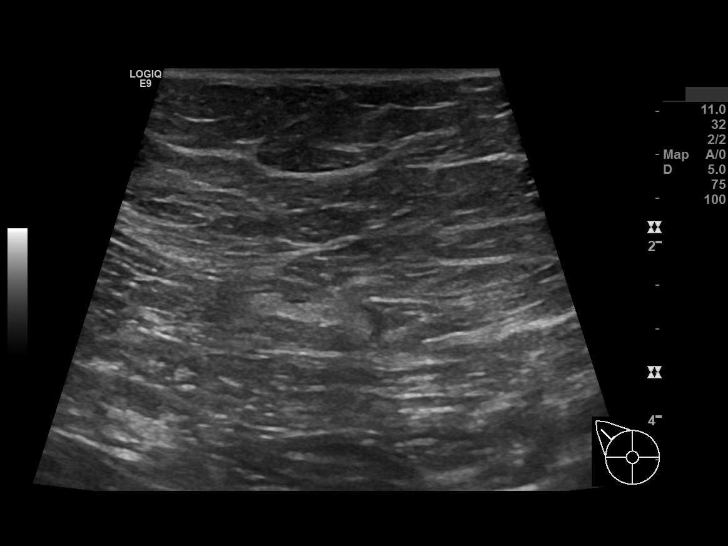
[im 17/17]
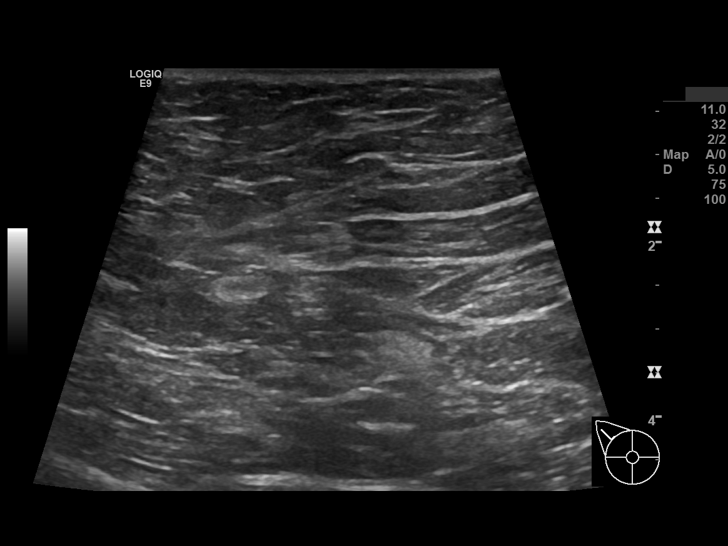

[14 of 17 positions shown; findings below may reference images not displayed]

IMPRESSION: 6 x 3 x 4 mm 3 o'clock right breast mass for which ultrasound directed biopsy for tissue diagnosis is recommended.
Findings were addressed and documented and recommendations were discussed with the patient at the time of her visit. She was given a copy of her results.
BI-RADS Category 4: Suspicious

## 2022-04-10 IMAGING — US BX BREAST US GUIDED RT
1 series · 13 of 16 positions shown · non-contrast
Comparison: 03/22/2022
PROCEDURE:
PATIENT CONSENT: Prior to the procedure risks, complications,alternatives and a description of the procedure were discussed.

Images Obtained from [facility]
INDICATION: 66-year-old woman here for right breast mass biopsy.

[Series 1: bx breast us guided right · 13 of 16 slices shown]
[im 1/16]
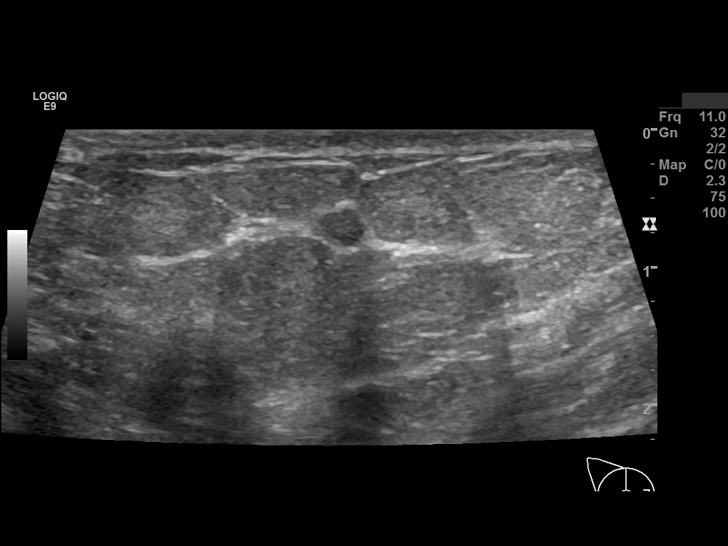
[im 2/16]
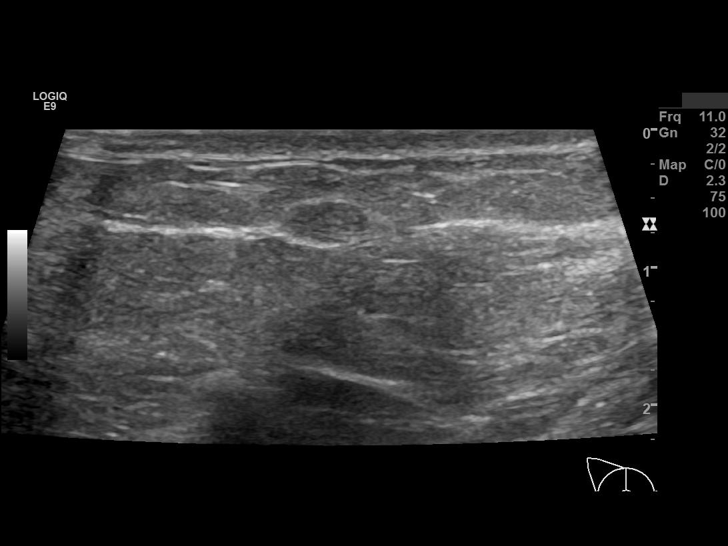
[im 4/16]
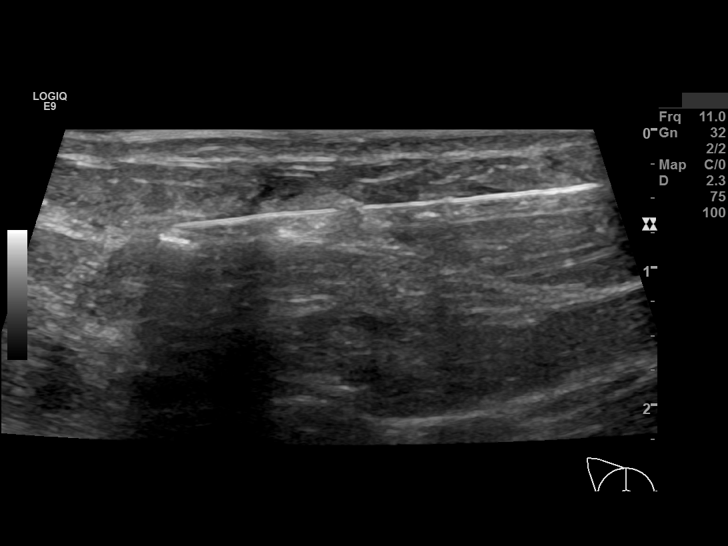
[im 5/16]
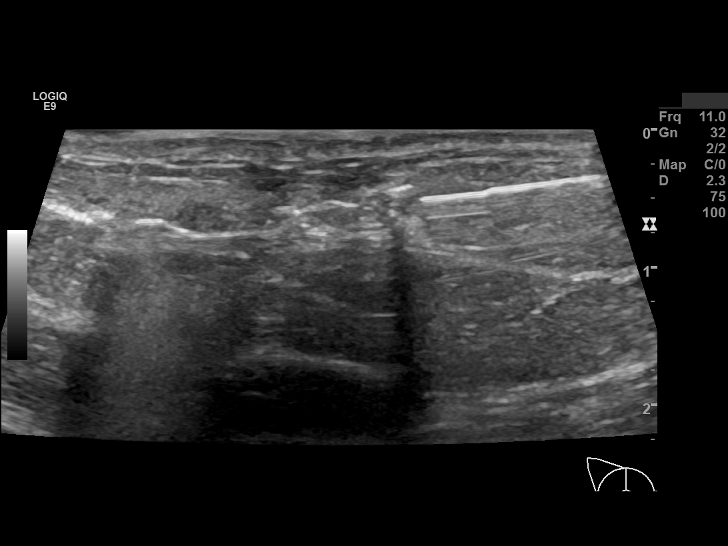
[im 6/16]
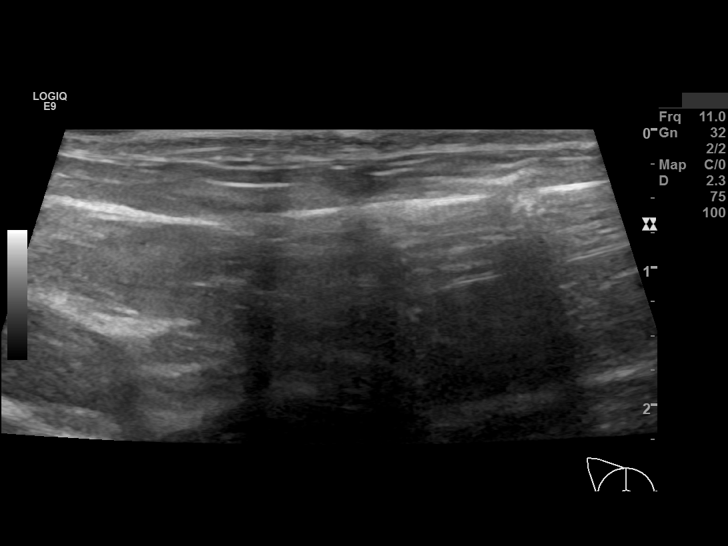
[im 7/16]
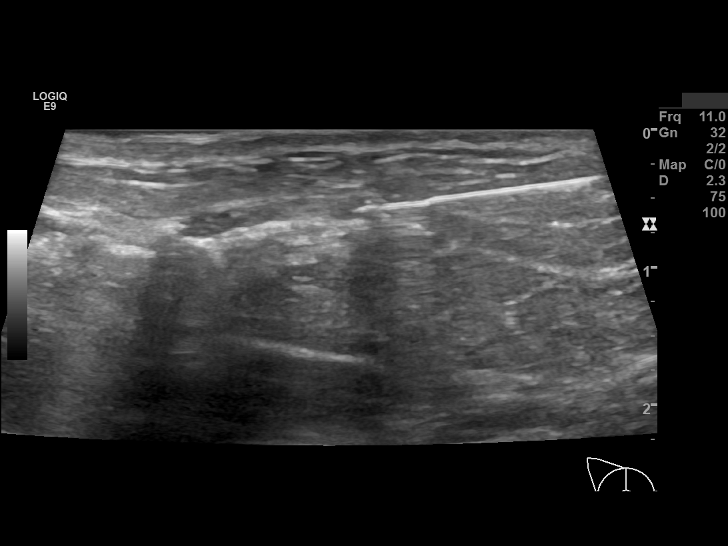
[im 9/16]
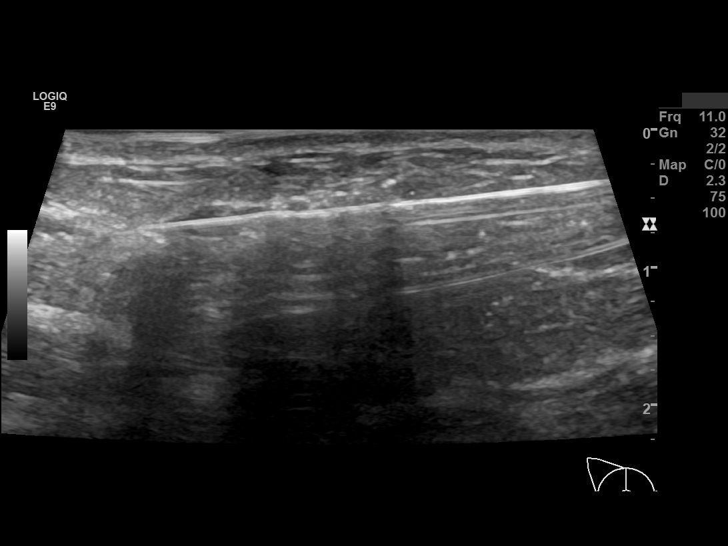
[im 10/16]
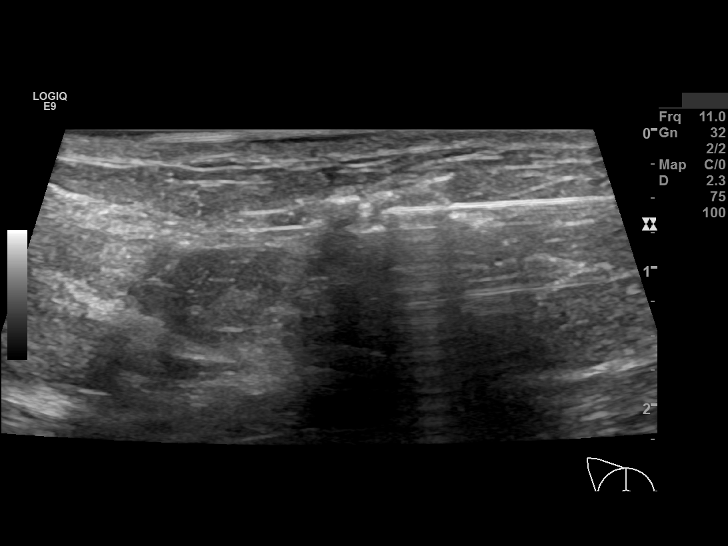
[im 11/16]
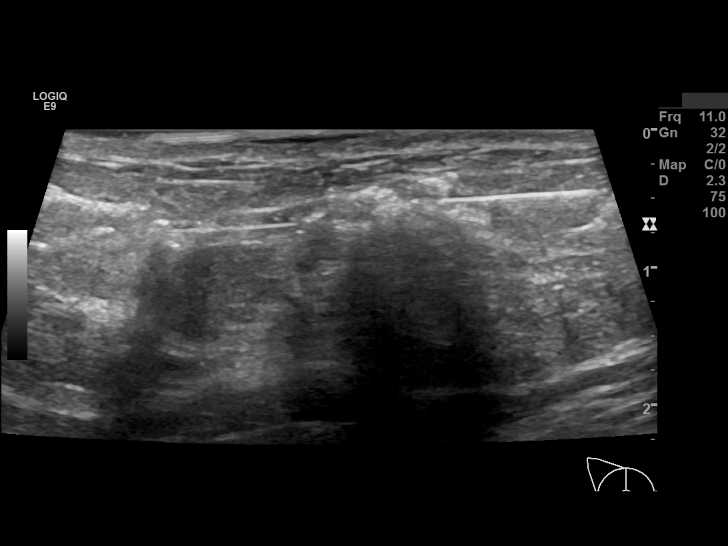
[im 12/16]
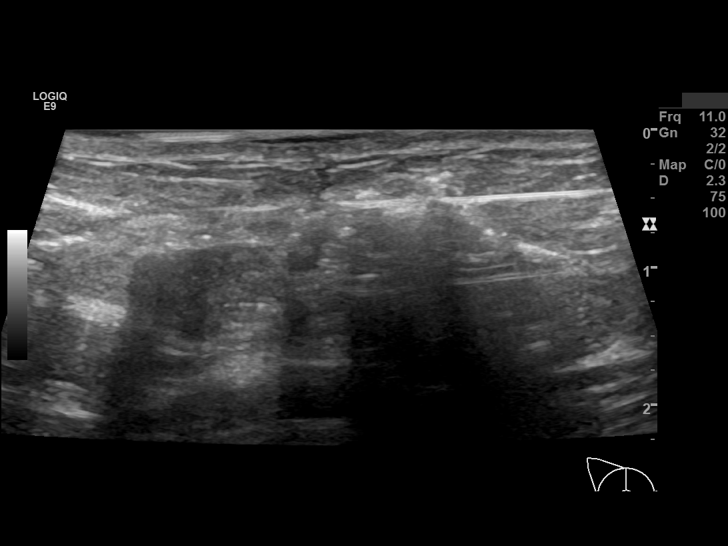
[im 13/16]
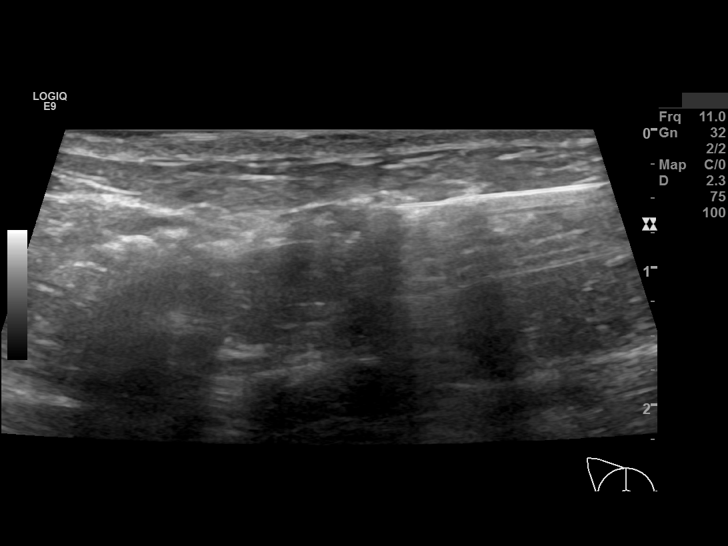
[im 15/16]
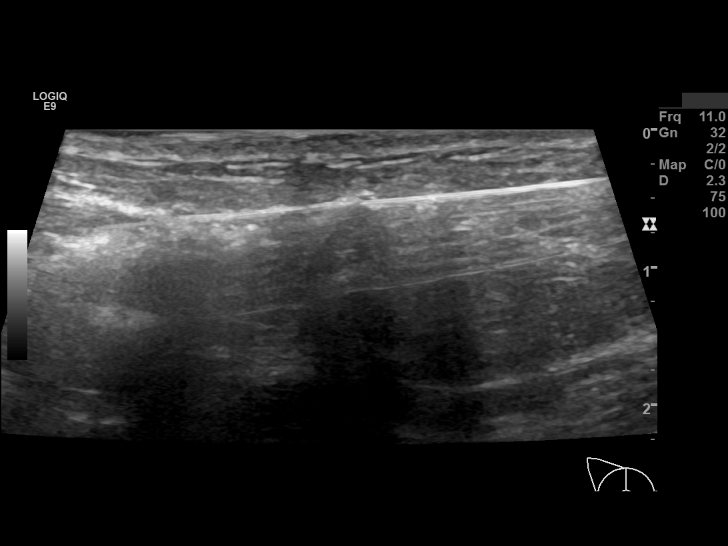
[im 16/16]
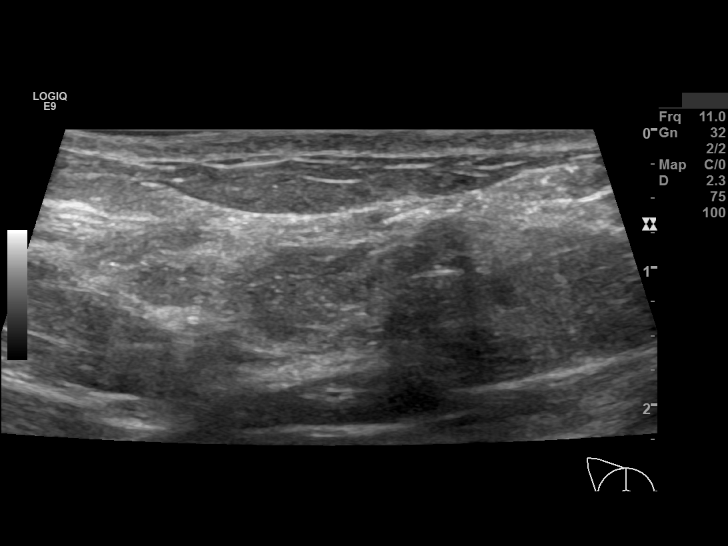

[13 of 16 positions shown; findings below may reference images not displayed]

All questions were answered.  Informed consent was obtained and signed.
PROCEDURE DESCRIPTION:
An ultrasound guided biopsy using real-time ultrasound was performed for the 0.6 cm mass in the right breast [DATE] 9 cm from the nipple.  This was described on the previous ultrasound report.  The
skin was prepped in the usual manner.  Local anesthetic was administered to the access site.  A skin nick was made in the breast.  A 14 gauge biopsy needle was placed adjacent to the abnormality
under ultrasound guidance.  Once the needle was documented to be in the correct location, multiple specimens were obtained using the Marquee biopsy needle.  A Hydromark clip was inserted into the
biopsy cavity.  A skin adhesive was applied to the access site.  Post procedure digital mammographic imaging with tomosynthesis demonstrates the clip at the targeted area.  The specimens were sent to
the laboratory for pathological analysis.
A female technologist was present throughout the procedure.
IMPRESSION: Benign
Ultrasound guided biopsy of the right breast mass was successful with no apparent post procedure complications.  Pathology indicates intraductal papilloma. Radiology and pathology results are
concordant. Surgical evaluation is recommended. If excision is not performed, six-month follow-up ultrasound would be recommended.
Results and recommendations were discussed with the patient.

## 2022-04-10 IMAGING — MG MAMMO DIAG RT W/CAD TOMO
4 series · 4 of 12 positions shown · non-contrast
Comparison: 03/22/2022
PROCEDURE:
PATIENT CONSENT: Prior to the procedure risks, complications,alternatives and a description of the procedure were discussed.

Images Obtained from [facility]
INDICATION: 66-year-old woman here for right breast mass biopsy.

[R CC]
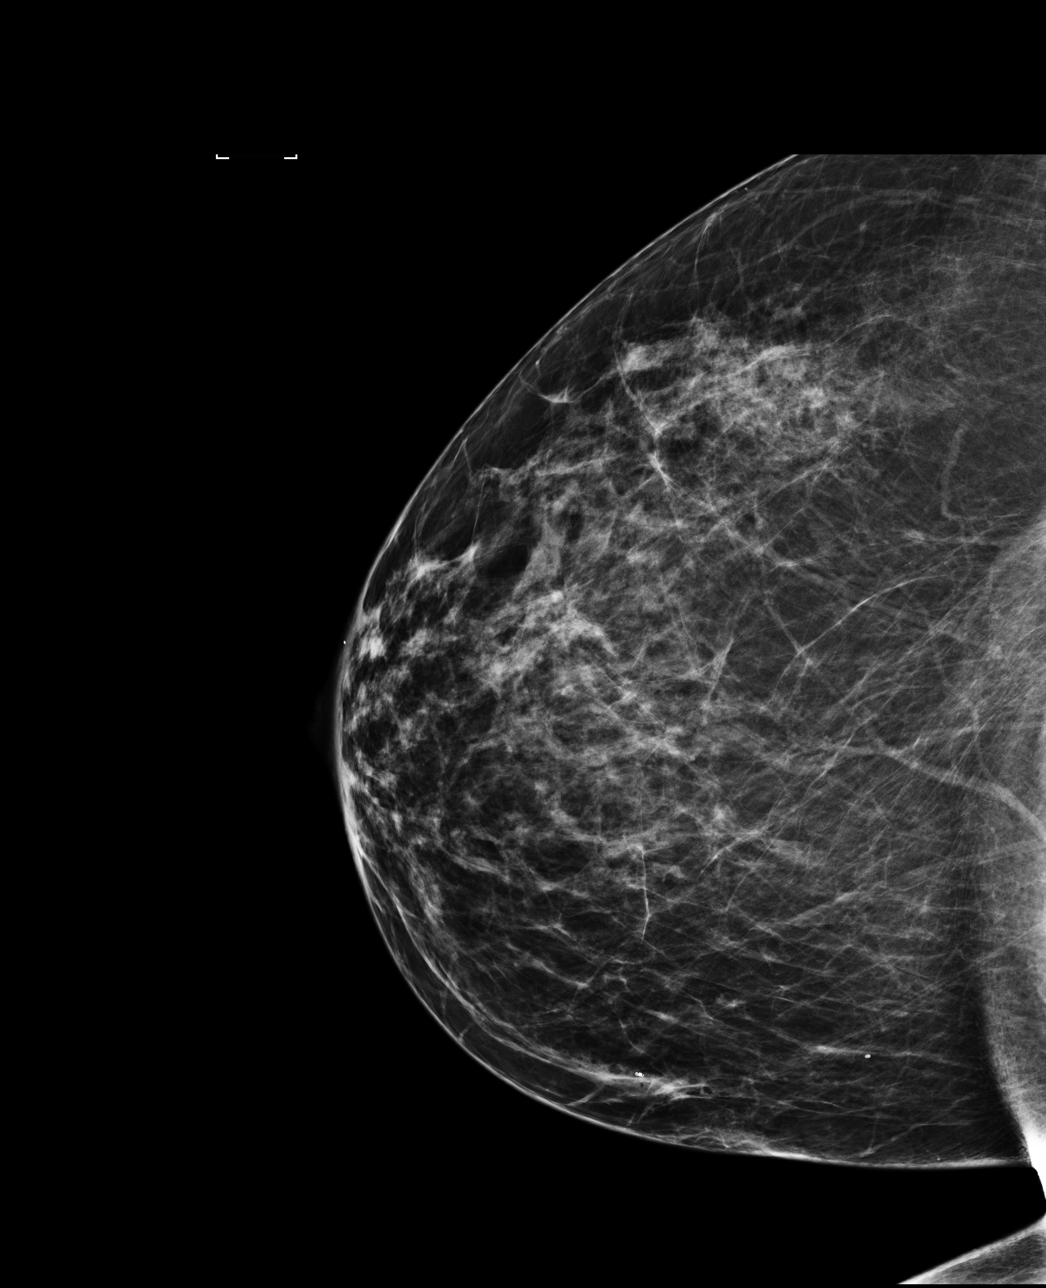

[R LM]
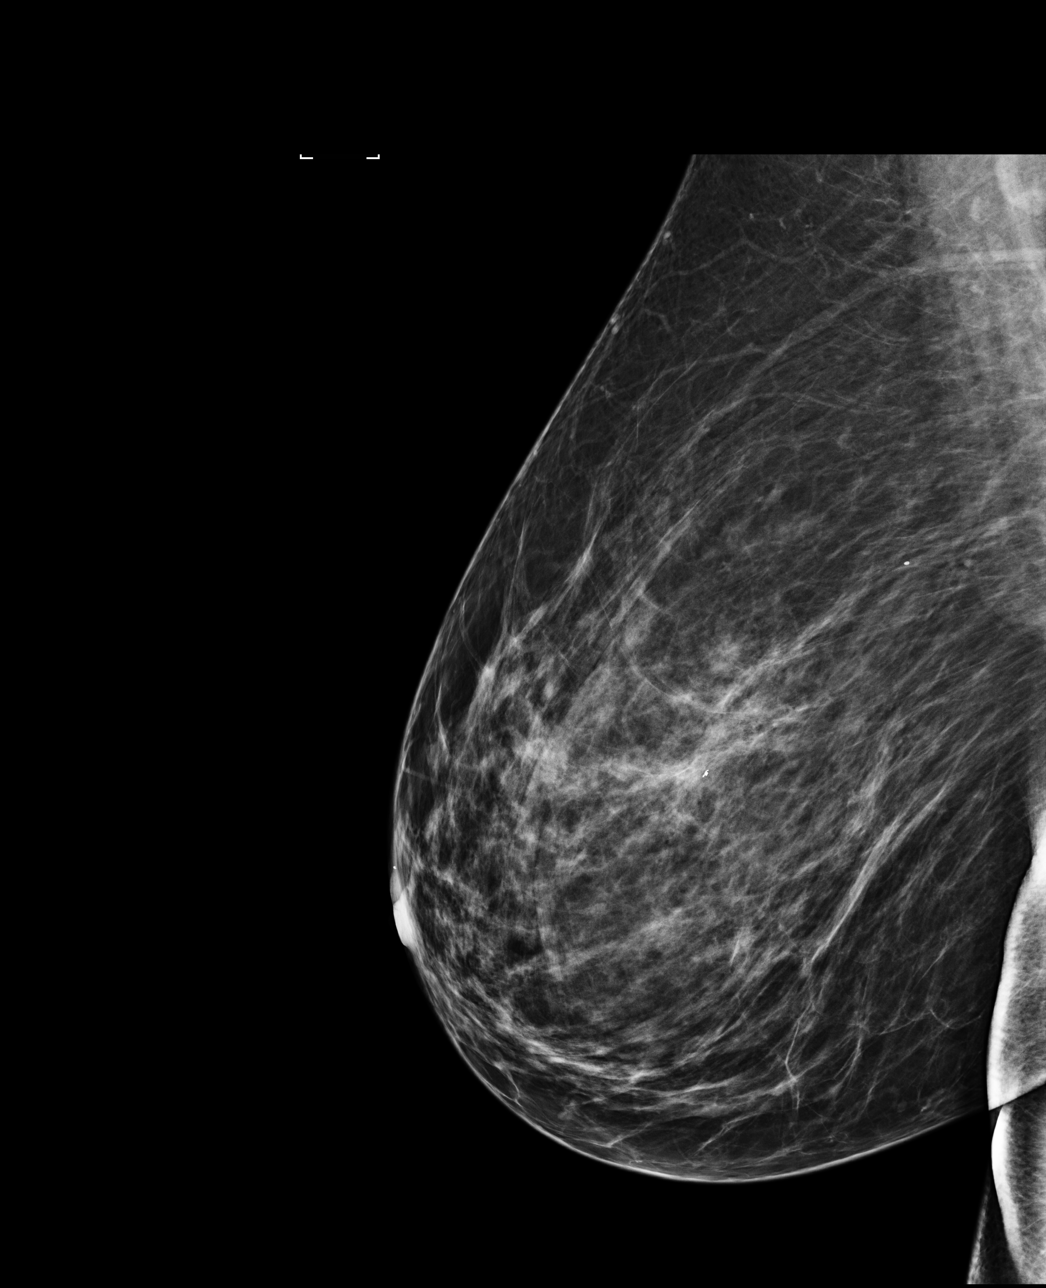

[R LM tomo · tomo slice 46/91.0]
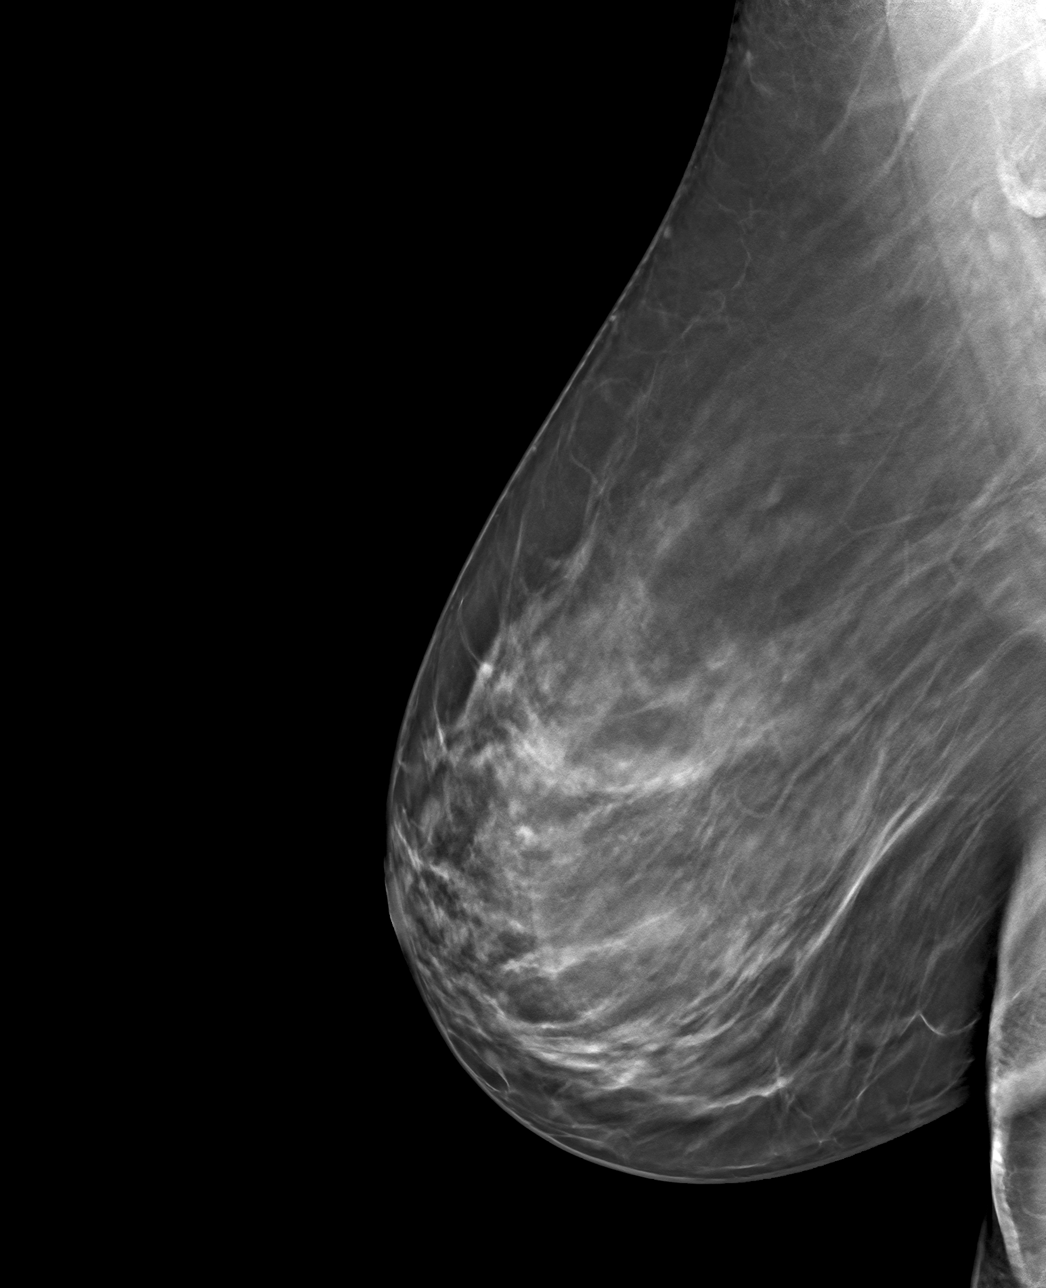

[R CC tomo · tomo slice 40/79.0]
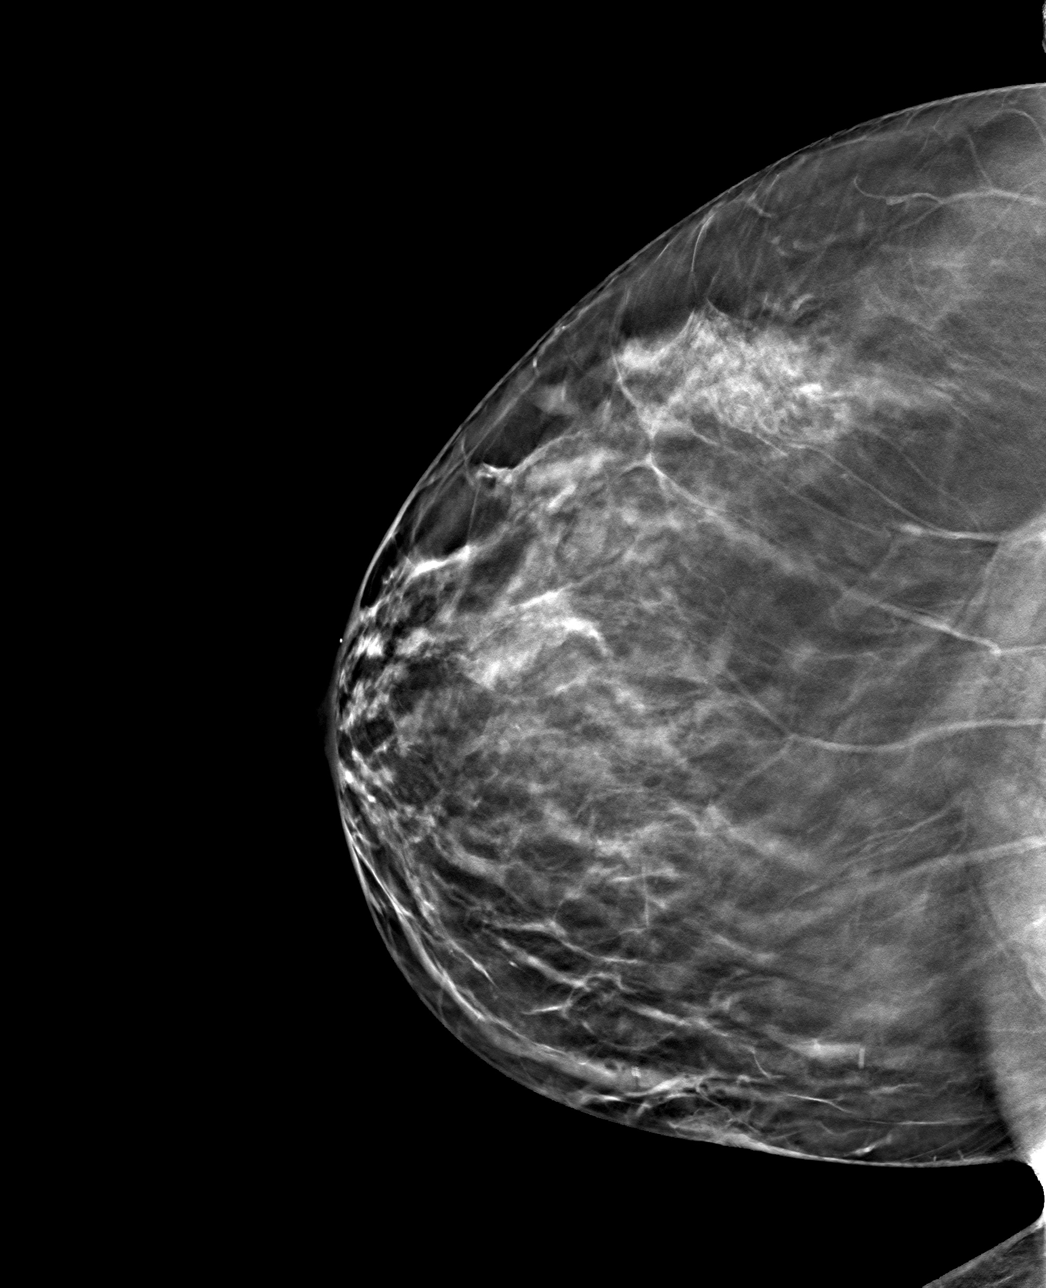

[4 of 12 positions shown; findings below may reference images not displayed]

All questions were answered.  Informed consent was obtained and signed.
PROCEDURE DESCRIPTION:
An ultrasound guided biopsy using real-time ultrasound was performed for the 0.6 cm mass in the right breast [DATE] 9 cm from the nipple.  This was described on the previous ultrasound report.  The
skin was prepped in the usual manner.  Local anesthetic was administered to the access site.  A skin nick was made in the breast.  A 14 gauge biopsy needle was placed adjacent to the abnormality
under ultrasound guidance.  Once the needle was documented to be in the correct location, multiple specimens were obtained using the Marquee biopsy needle.  A Hydromark clip was inserted into the
biopsy cavity.  A skin adhesive was applied to the access site.  Post procedure digital mammographic imaging with tomosynthesis demonstrates the clip at the targeted area.  The specimens were sent to
the laboratory for pathological analysis.
A female technologist was present throughout the procedure.
IMPRESSION: Benign
Ultrasound guided biopsy of the right breast mass was successful with no apparent post procedure complications.  Pathology indicates intraductal papilloma. Radiology and pathology results are
concordant. Surgical evaluation is recommended. If excision is not performed, six-month follow-up ultrasound would be recommended.
Results and recommendations were discussed with the patient.

## 2022-06-14 IMAGING — CT CT CARDIAC CALCIUM SCORING
1 of 2 series · 11 of 20 positions shown, 14 images · non-contrast
Comparison: None

Images Obtained from [facility]
REASON FOR EXAM: Mixed hyperlipidemia,
TECHNIQUE: CT images of the heart were acquired without the administration of IV contrast.  Post processing software quantified calcium (Threshold 130 HU) in the coronary arteries with Agatston
scores. Coronary screening is most appropriately used in asymptomatic patients with clinical risk factors for coronary atherosclerosis.
Dose reduction technique used: automated exposure control and adjustment of the mA and/or kV according to patient size. CT Studies and Cardiac Nuclear Medicine Studies in last 12-months = 0
Total radiation dose to patient is CTDIvol 12.70 mGy and DLP 233.00 mGy-cm.
Number of Calcified Coronary Artery Plaques
Left Main: 0
Left Anterior Descending: 0
Circumflex: 0
Right Coronary Artery: 0
Total: 0
Coronary Artery Calcium Score
Right Coronary Artery and Posterior Descending Artery: 0
TOTAL AGATSTON SCORE: 0
OTHER FINDINGS:  Bibasal reticular scarring is incidentally seen.
IMPRESSION
Agatston score of 0; there are no CT findings of coronary atherosclerosis and findings indicate a very low future risk for coronary event.
Reference Table
*  0: No evidence of CAD (very low risk of future coronary event)
*  1-10: Minimal CAD (low risk of future coronary event)
*  11-100: Mild CAD (increased risk of future coronary event). Consider evaluation for risk factors and managing according to primary prevention guidelines.
*  101-400: Moderate CAD (relative lifetime risk of coronary event is 4.3 times a patient with a score of 0)*. Consider evaluation for risk factors and managing according to primary prevention
guidelines.
*  244-0444: Severe CAD (relative lifetime risk of coronary event is 7.2 times a patient with a score of 0)*. Consider evaluation for risk factors and managing according to primary prevention
*  >4555 (relative lifetime risk of coronary event is 10.8 times a patient with a score of 0)*.  Consider evaluation for risk factors and managing according to primary prevention guidelines.
*Miko, et al, Clinical expert consensus document on coronary calcium screening, J Am Coll Cardiology. 8227; [DATE]
Clinical risk factors can be combined with Agatston coronary calcium score to determine 10 year risk for coronary event using MESA risk calculator at:

[Series 3: calcium score · axial · 0.42mm/px · z∈[-1388,-1265]mm · 11 of 100 slices shown, 14 images]
[im 9/100  vessel]
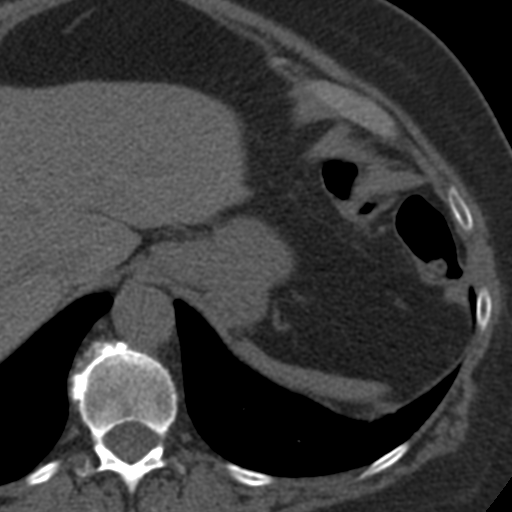
[im 9/100  lung]
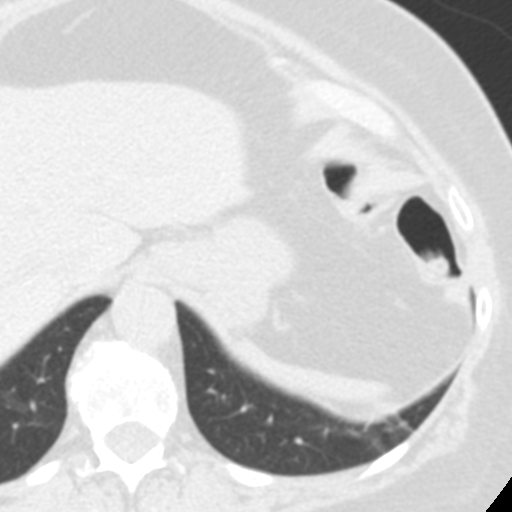
[im 17/100  vessel]
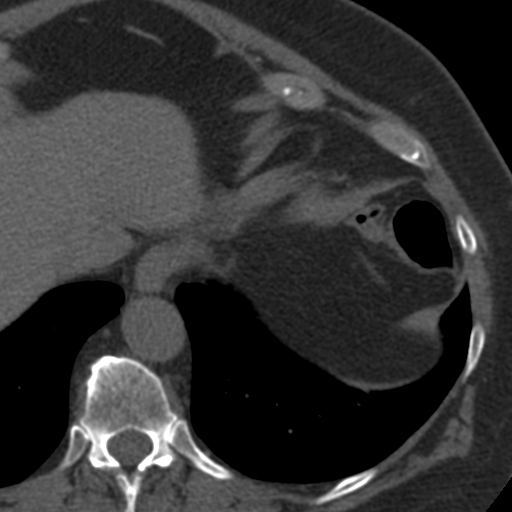
[im 25/100  vessel]
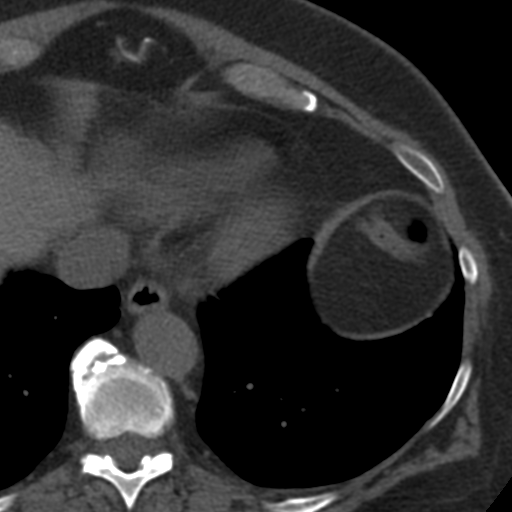
[im 34/100  vessel]
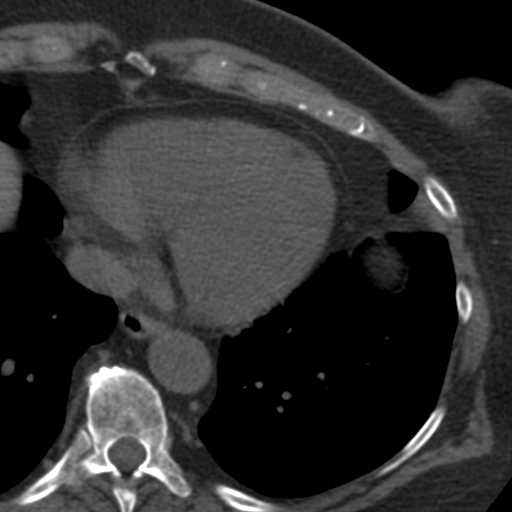
[im 42/100  vessel]
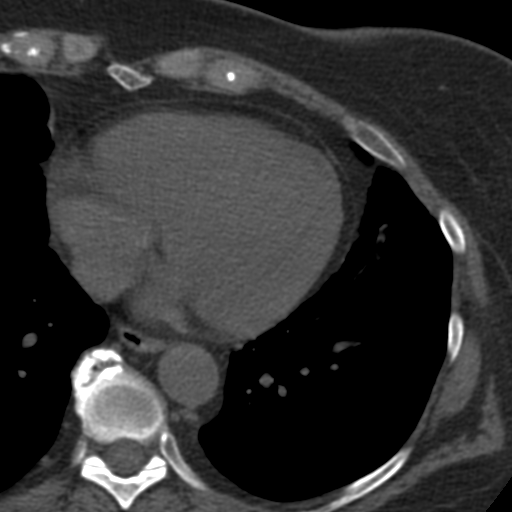
[im 42/100  lung]
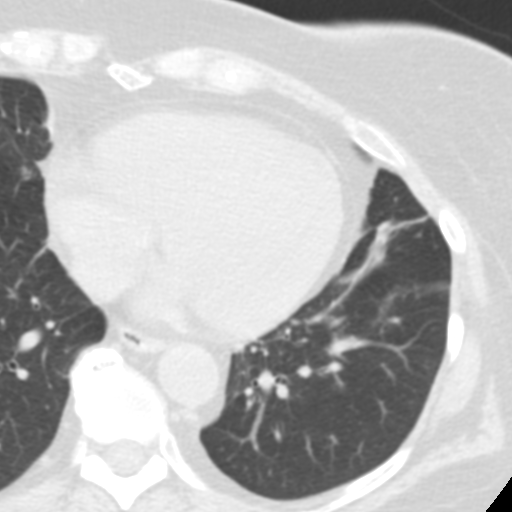
[im 50/100  vessel]
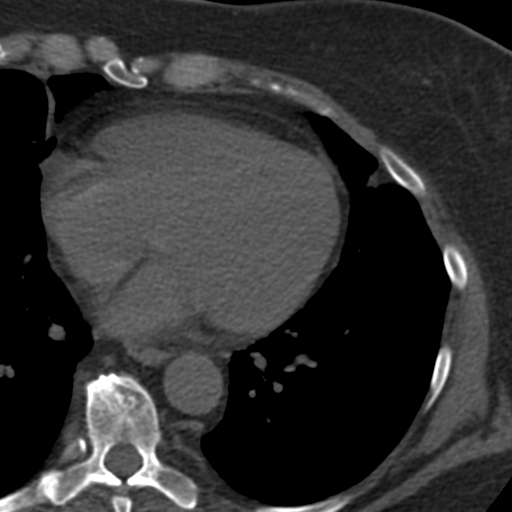
[im 58/100  vessel]
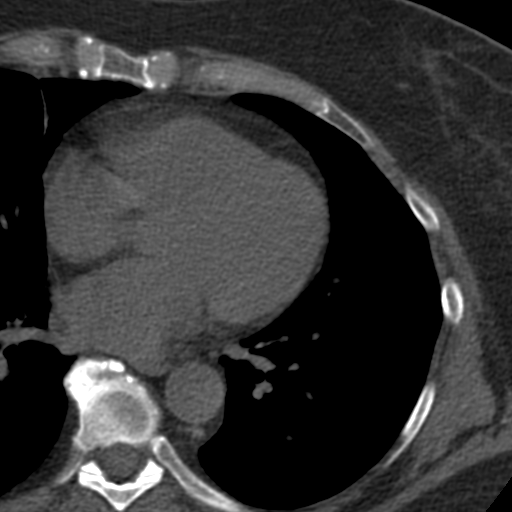
[im 67/100  vessel]
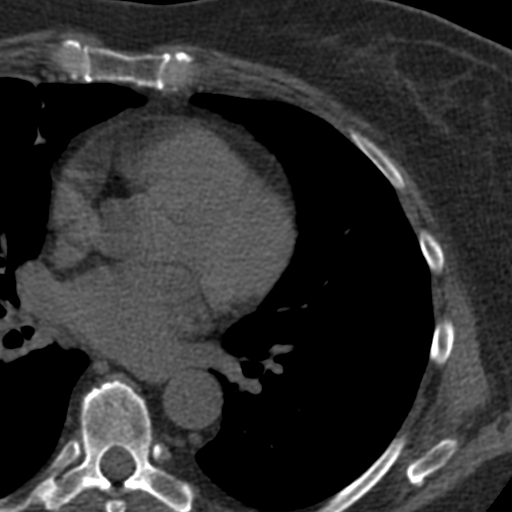
[im 75/100  vessel]
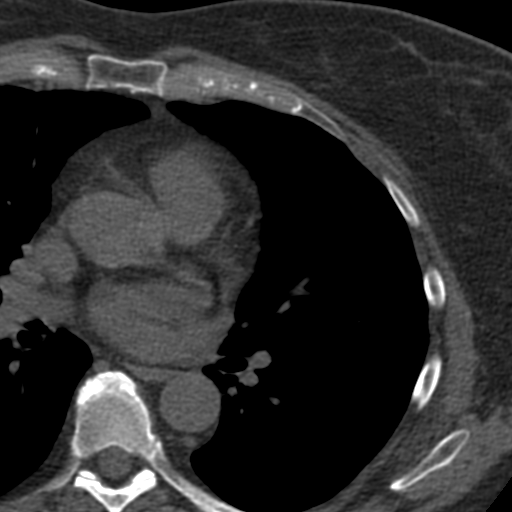
[im 75/100  lung]
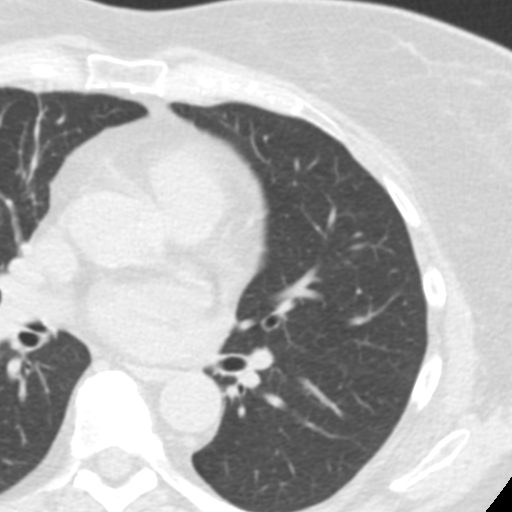
[im 83/100  vessel]
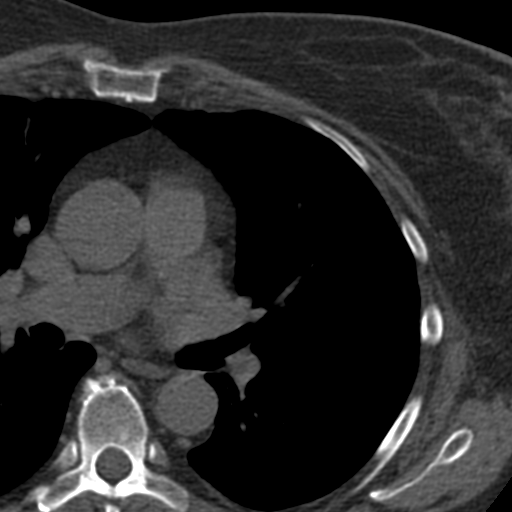
[im 91/100  vessel]
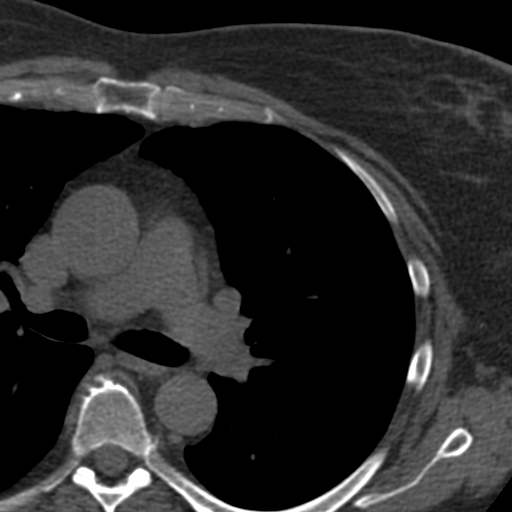

[11 of 20 positions shown; findings below may reference images not displayed]
